# Patient Record
Sex: Female | Born: 1970 | Hispanic: No | Marital: Married | Smoking: Former smoker
Health system: Southern US, Community
[De-identification: ages and names within clinical notes are randomized; demographics above are authoritative.]

## PROBLEM LIST (undated history)

## (undated) DIAGNOSIS — K259 Gastric ulcer, unspecified as acute or chronic, without hemorrhage or perforation: Secondary | ICD-10-CM

## (undated) DIAGNOSIS — K648 Other hemorrhoids: Secondary | ICD-10-CM

## (undated) DIAGNOSIS — K219 Gastro-esophageal reflux disease without esophagitis: Secondary | ICD-10-CM

## (undated) HISTORY — PX: ECTOPIC PREGNANCY SURGERY: SHX613

## (undated) HISTORY — DX: Gastric ulcer, unspecified as acute or chronic, without hemorrhage or perforation: K25.9

## (undated) HISTORY — PX: EYE SURGERY: SHX253

## (undated) HISTORY — PX: KNEE SURGERY: SHX244

## (undated) HISTORY — PX: TUBAL LIGATION: SHX77

## (undated) HISTORY — DX: Gastro-esophageal reflux disease without esophagitis: K21.9

## (undated) HISTORY — PX: APPENDECTOMY: SHX54

## (undated) HISTORY — DX: Other hemorrhoids: K64.8

## (undated) HISTORY — PX: ABDOMINAL HYSTERECTOMY: SHX81

---

## 1998-10-06 ENCOUNTER — Other Ambulatory Visit: Admission: RE | Admit: 1998-10-06 | Discharge: 1998-10-06 | Payer: Self-pay | Admitting: Family Medicine

## 2000-07-29 ENCOUNTER — Other Ambulatory Visit: Admission: RE | Admit: 2000-07-29 | Discharge: 2000-07-29 | Payer: Self-pay | Admitting: Family Medicine

## 2000-07-31 ENCOUNTER — Encounter: Payer: Self-pay | Admitting: Family Medicine

## 2000-07-31 ENCOUNTER — Ambulatory Visit (HOSPITAL_COMMUNITY): Admission: RE | Admit: 2000-07-31 | Discharge: 2000-07-31 | Payer: Self-pay | Admitting: Family Medicine

## 2002-02-03 ENCOUNTER — Other Ambulatory Visit: Admission: RE | Admit: 2002-02-03 | Discharge: 2002-02-03 | Payer: Self-pay | Admitting: Gynecology

## 2003-01-25 ENCOUNTER — Other Ambulatory Visit: Admission: RE | Admit: 2003-01-25 | Discharge: 2003-01-25 | Payer: Self-pay | Admitting: Dermatology

## 2007-01-30 DIAGNOSIS — K648 Other hemorrhoids: Secondary | ICD-10-CM

## 2007-01-30 HISTORY — DX: Other hemorrhoids: K64.8

## 2007-07-01 ENCOUNTER — Ambulatory Visit: Payer: Self-pay | Admitting: Gastroenterology

## 2007-07-01 DIAGNOSIS — R1032 Left lower quadrant pain: Secondary | ICD-10-CM | POA: Insufficient documentation

## 2007-07-01 DIAGNOSIS — K219 Gastro-esophageal reflux disease without esophagitis: Secondary | ICD-10-CM | POA: Insufficient documentation

## 2007-07-01 DIAGNOSIS — R197 Diarrhea, unspecified: Secondary | ICD-10-CM | POA: Insufficient documentation

## 2007-07-01 HISTORY — DX: Left lower quadrant pain: R10.32

## 2007-07-01 LAB — CONVERTED CEMR LAB
ALT: 17 units/L (ref 0–35)
AST: 22 units/L (ref 0–37)
Albumin: 3.9 g/dL (ref 3.5–5.2)
Alkaline Phosphatase: 56 units/L (ref 39–117)
BUN: 13 mg/dL (ref 6–23)
Basophils Absolute: 0 10*3/uL (ref 0.0–0.1)
Basophils Relative: 0.3 % (ref 0.0–1.0)
Bilirubin, Direct: 0.1 mg/dL (ref 0.0–0.3)
CO2: 27 meq/L (ref 19–32)
Calcium: 9.1 mg/dL (ref 8.4–10.5)
Chloride: 104 meq/L (ref 96–112)
Creatinine, Ser: 0.9 mg/dL (ref 0.4–1.2)
Eosinophils Absolute: 0.1 10*3/uL (ref 0.0–0.7)
Eosinophils Relative: 2.6 % (ref 0.0–5.0)
GFR calc Af Amer: 91 mL/min
GFR calc non Af Amer: 75 mL/min
Glucose, Bld: 91 mg/dL (ref 70–99)
HCT: 38.1 % (ref 36.0–46.0)
Hemoglobin: 12.9 g/dL (ref 12.0–15.0)
Lymphocytes Relative: 32.5 % (ref 12.0–46.0)
MCHC: 33.7 g/dL (ref 30.0–36.0)
MCV: 89.7 fL (ref 78.0–100.0)
Monocytes Absolute: 0.3 10*3/uL (ref 0.1–1.0)
Monocytes Relative: 6.4 % (ref 3.0–12.0)
Neutro Abs: 3.2 10*3/uL (ref 1.4–7.7)
Neutrophils Relative %: 58.2 % (ref 43.0–77.0)
Platelets: 302 10*3/uL (ref 150–400)
Potassium: 3.7 meq/L (ref 3.5–5.1)
RBC: 4.25 M/uL (ref 3.87–5.11)
RDW: 12.6 % (ref 11.5–14.6)
Sed Rate: 13 mm/hr (ref 0–22)
Sodium: 140 meq/L (ref 135–145)
TSH: 0.99 microintl units/mL (ref 0.35–5.50)
Tissue Transglutaminase Ab, IgA: 0.4 units (ref <7)
Total Bilirubin: 0.9 mg/dL (ref 0.3–1.2)
Total Protein: 7.1 g/dL (ref 6.0–8.3)
WBC: 5.3 10*3/uL (ref 4.5–10.5)

## 2007-07-03 ENCOUNTER — Encounter: Payer: Self-pay | Admitting: Gastroenterology

## 2007-07-09 ENCOUNTER — Ambulatory Visit: Payer: Self-pay | Admitting: Gastroenterology

## 2007-07-09 ENCOUNTER — Encounter: Payer: Self-pay | Admitting: Gastroenterology

## 2007-07-09 DIAGNOSIS — K648 Other hemorrhoids: Secondary | ICD-10-CM | POA: Insufficient documentation

## 2007-07-15 ENCOUNTER — Encounter: Payer: Self-pay | Admitting: Gastroenterology

## 2007-07-24 ENCOUNTER — Ambulatory Visit: Payer: Self-pay | Admitting: Gastroenterology

## 2009-05-30 ENCOUNTER — Ambulatory Visit (HOSPITAL_COMMUNITY): Admission: RE | Admit: 2009-05-30 | Discharge: 2009-05-31 | Payer: Self-pay | Admitting: Obstetrics and Gynecology

## 2009-05-30 ENCOUNTER — Encounter (INDEPENDENT_AMBULATORY_CARE_PROVIDER_SITE_OTHER): Payer: Self-pay | Admitting: Obstetrics and Gynecology

## 2009-06-15 ENCOUNTER — Inpatient Hospital Stay (HOSPITAL_COMMUNITY): Admission: AD | Admit: 2009-06-15 | Discharge: 2009-06-16 | Payer: Self-pay | Admitting: Obstetrics and Gynecology

## 2010-02-19 ENCOUNTER — Encounter: Payer: Self-pay | Admitting: Family Medicine

## 2010-03-15 ENCOUNTER — Other Ambulatory Visit: Payer: Self-pay | Admitting: Family Medicine

## 2010-03-15 DIAGNOSIS — R109 Unspecified abdominal pain: Secondary | ICD-10-CM

## 2010-04-17 LAB — URINALYSIS, ROUTINE W REFLEX MICROSCOPIC
Ketones, ur: NEGATIVE mg/dL
Leukocytes, UA: NEGATIVE
Nitrite: NEGATIVE
Specific Gravity, Urine: 1.01 (ref 1.005–1.030)
pH: 5.5 (ref 5.0–8.0)

## 2010-04-17 LAB — CBC
MCV: 89.4 fL (ref 78.0–100.0)
Platelets: 371 10*3/uL (ref 150–400)
WBC: 8.3 10*3/uL (ref 4.0–10.5)

## 2010-04-17 LAB — URINE MICROSCOPIC-ADD ON

## 2010-04-18 LAB — BASIC METABOLIC PANEL
BUN: 15 mg/dL (ref 6–23)
Chloride: 102 mEq/L (ref 96–112)
GFR calc Af Amer: >60 mL/min (ref >60)
Potassium: 4.1 mEq/L (ref 3.5–5.1)

## 2010-04-18 LAB — CBC
HCT: 33.3 % — ABNORMAL LOW (ref 36.0–46.0)
HCT: 40.4 % (ref 36.0–46.0)
MCV: 90.6 fL (ref 78.0–100.0)
Platelets: 224 10*3/uL (ref 150–400)
RBC: 3.61 MIL/uL — ABNORMAL LOW (ref 3.87–5.11)
RBC: 4.46 MIL/uL (ref 3.87–5.11)
WBC: 10.5 10*3/uL (ref 4.0–10.5)
WBC: 6.3 10*3/uL (ref 4.0–10.5)

## 2012-01-30 HISTORY — PX: ESOPHAGOGASTRODUODENOSCOPY: SHX1529

## 2012-01-30 HISTORY — PX: COLONOSCOPY: SHX174

## 2012-06-24 ENCOUNTER — Encounter: Payer: Self-pay | Admitting: General Practice

## 2012-06-24 ENCOUNTER — Emergency Department (HOSPITAL_COMMUNITY)
Admission: EM | Admit: 2012-06-24 | Discharge: 2012-06-24 | Disposition: A | Discharge status: Home / Self Care | Payer: Managed Care, Other (non HMO) | Attending: Emergency Medicine | Admitting: Emergency Medicine

## 2012-06-24 ENCOUNTER — Encounter (HOSPITAL_COMMUNITY): Payer: Self-pay | Admitting: *Deleted

## 2012-06-24 ENCOUNTER — Ambulatory Visit (INDEPENDENT_AMBULATORY_CARE_PROVIDER_SITE_OTHER): Payer: Managed Care, Other (non HMO) | Admitting: General Practice

## 2012-06-24 ENCOUNTER — Emergency Department (HOSPITAL_COMMUNITY): Payer: Managed Care, Other (non HMO)

## 2012-06-24 VITALS — BP 106/72 | HR 68 | Temp 97.9°F | Ht 64.75 in | Wt 209.0 lb

## 2012-06-24 DIAGNOSIS — R079 Chest pain, unspecified: Secondary | ICD-10-CM

## 2012-06-24 DIAGNOSIS — R0789 Other chest pain: Secondary | ICD-10-CM | POA: Insufficient documentation

## 2012-06-24 DIAGNOSIS — Z87891 Personal history of nicotine dependence: Secondary | ICD-10-CM | POA: Insufficient documentation

## 2012-06-24 LAB — CBC
HCT: 38.4 % (ref 36.0–46.0)
MCHC: 33.1 g/dL (ref 30.0–36.0)
MCV: 84.8 fL (ref 78.0–100.0)
RDW: 13.2 % (ref 11.5–15.5)

## 2012-06-24 LAB — BASIC METABOLIC PANEL
BUN: 16 mg/dL (ref 6–23)
Creatinine, Ser: 0.86 mg/dL (ref 0.50–1.10)
GFR calc Af Amer: >90 mL/min (ref >90)
GFR calc non Af Amer: 82 mL/min — ABNORMAL LOW (ref >90)

## 2012-06-24 NOTE — ED Notes (Signed)
PT states that she started having left chest pain and between shoulder blades that started on Saturday while drinking with friends.  Pt states pain in back did go away but continues to have heaviness in her chest

## 2012-06-24 NOTE — ED Provider Notes (Signed)
History     CSN: [id number]  Arrival date & time 06/24/12  1422   First MD Initiated Contact with Patient 06/24/12 1558      Chief Complaint  Patient presents with  . Chest Pain    (Consider location/radiation/quality/duration/timing/severity/associated sxs/prior treatment) HPI Comments: 42 y.o. female w/ no significant pmh presents to the Er w/ the cc of chest pain. Has been present for the past 3 days. Started when pt was 'having drinks with friends' on Saturday. Pt states that pain has been waxing and waning. Is not related to activity. No n/v associated w/ this. No diaphoresis associated w/ this. No exertional component. Pt states that she has a "heaviness" in her chest, and not a pain currently. She does not have pain that radiates down her right arm or both arms. She was seen by pcp today, and then told to come to the ER for further evaluation.   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   Patient is a 42 y.o. female presenting with general illness. The history is provided by the patient.  Illness Location:  Chest pain Severity:  Mild Duration:  3 days Timing:  Intermittent Progression:  Waxing and waning Chronicity:  New Associated symptoms: chest pain   Associated symptoms: no abdominal pain, no congestion, no cough, no diarrhea, no fatigue, no fever, no headaches, no rash, no vomiting and no wheezing     History reviewed. No pertinent past medical history.  Past Surgical History  Procedure Laterality Date  . Appendectomy    . Abdominal hysterectomy    . Eye surgery    . Tubal ligation    . Ectopic pregnancy surgery    . Knee surgery Left     Family History  Problem Relation Age of Onset  . Anuerysm Mother     History  Substance Use Topics  . Smoking  status: Former Games developer  . Smokeless tobacco: Not on file  . Alcohol Use: Yes     Comment: occ    OB History   Grav Para Term Preterm Abortions TAB SAB Ect Mult Living                  Review of Systems  Constitutional: Negative for fever, chills and fatigue.  HENT: Negative for congestion, facial swelling, drooling, neck pain and dental problem.   Eyes: Negative for pain, discharge and itching.  Respiratory: Negative for cough, choking, wheezing and stridor.   Cardiovascular: Positive for chest pain.  Gastrointestinal: Negative for vomiting, abdominal pain and diarrhea.  Endocrine: Negative for cold intolerance and heat intolerance.  Genitourinary: Negative for vaginal discharge, difficulty urinating and vaginal pain.  Skin: Negative for pallor and rash.  Neurological: Negative for dizziness, light-headedness and headaches.  Psychiatric/Behavioral: Negative for behavioral problems and agitation.    Allergies  Biaxin and Demerol  Home Medications   Current Outpatient Rx  Name  Route  Sig  Dispense  Refill  . Cholecalciferol (VITAMIN D PO)   Oral   Take 1 tablet by mouth daily.         . Naproxen Sodium (ALEVE) 220 MG CAPS   Oral   Take 440 mg by mouth daily as needed (shoulder pain).  BP 137/75  Pulse 66  Temp(Src) 98.4 F (36.9 C) (Oral)  Resp 18  SpO2 100%  Physical Exam  Constitutional: She is oriented to person, place, and time. She appears well-developed. No distress.  HENT:  Head: Normocephalic and atraumatic.  Eyes: Pupils are equal, round, and reactive to light. Right eye exhibits no discharge. Left eye exhibits no discharge.  Neck: Neck supple. No tracheal deviation present.  Cardiovascular: Normal rate.  Exam reveals no gallop and no friction rub.   Pulmonary/Chest: No stridor. No respiratory distress. She has no wheezes. Tenderness: mild chest wall ttp.  Abdominal: Soft. She exhibits no distension. There is no tenderness. There is no  rebound.  Musculoskeletal: She exhibits no edema and no tenderness.  Neurological: She is alert and oriented to person, place, and time.  Skin: Skin is warm. She is not diaphoretic.    ED Course  Procedures (including critical care time)  Labs Reviewed  BASIC METABOLIC PANEL - Abnormal; Notable for the following:    GFR calc non Af Amer 82 (*)    All other components within normal limits  CBC  POCT I-STAT TROPONIN I   Dg Chest 2 View  06/24/2012   *RADIOLOGY REPORT*  Clinical Data: Chest pain and chest tightness.  CHEST - 2 VIEW  Comparison: No priors.  Findings: Lung volumes are normal.  No consolidative airspace disease.  No pleural effusions.  No pneumothorax.  No pulmonary nodule or mass noted.  Pulmonary vasculature and the cardiomediastinal silhouette are within normal limits.  IMPRESSION: 1. No radiographic evidence of acute cardiopulmonary disease.   Original Report Authenticated By: Trudie Reed, M.D.    Date: 06/24/2012  Rate: 68  Rhythm: normal sinus rhythm  QRS Axis: normal  Intervals: normal  ST/T Wave abnormalities: normal  Conduction Disutrbances:none  Narrative Interpretation: No inverted T waves or pathologic ST wave abnormalities.   Old EKG Reviewed: none available    MDM  Pt with atypical story for ACS -- ECG is normal and trop is negative. Her TIMI score is 0, she has no risk factors for ACS -- no family hx of cardiac disease before age 27, and no hx of htn / no smoking / no hx of diabetes. Would expect to see ECG changes or elevated trop if pt was truly having ACS over the past few days. Chest x-ray does not show pneumonia. Doubt PE, pt is PERC negative.   Lipase is pending.   Lipase normal. Doubt pt is having ACS with normal labs, would have expected trop to be elevated by now w/ pt having symptoms over the past 3 days. Have told pt f/u with pcp is very important as further workup might be required -- told to f/u tomorrow, which she states she will do.     1. Chest pain              Bernadene Person, MD 06/24/12 2359

## 2012-06-24 NOTE — Patient Instructions (Addendum)
Chest Pain (Nonspecific) °It is often hard to give a specific diagnosis for the cause of chest pain. There is always a chance that your pain could be related to something serious, such as a heart attack or a blood clot in the lungs. You need to follow up with your caregiver for further evaluation. °CAUSES  °· Heartburn. °· Pneumonia or bronchitis. °· Anxiety or stress. °· Inflammation around your heart (pericarditis) or lung (pleuritis or pleurisy). °· A blood clot in the lung. °· A collapsed lung (pneumothorax). It can develop suddenly on its own (spontaneous pneumothorax) or from injury (trauma) to the chest. °· Shingles infection (herpes zoster virus). °The chest wall is composed of bones, muscles, and cartilage. Any of these can be the source of the pain. °· The bones can be bruised by injury. °· The muscles or cartilage can be strained by coughing or overwork. °· The cartilage can be affected by inflammation and become sore (costochondritis). °DIAGNOSIS  °Lab tests or other studies, such as X-rays, electrocardiography, stress testing, or cardiac imaging, may be needed to find the cause of your pain.  °TREATMENT  °· Treatment depends on what may be causing your chest pain. Treatment may include: °· Acid blockers for heartburn. °· Anti-inflammatory medicine. °· Pain medicine for inflammatory conditions. °· Antibiotics if an infection is present. °· You may be advised to change lifestyle habits. This includes stopping smoking and avoiding alcohol, caffeine, and chocolate. °· You may be advised to keep your head raised (elevated) when sleeping. This reduces the chance of acid going backward from your stomach into your esophagus. °· Most of the time, nonspecific chest pain will improve within 2 to 3 days with rest and mild pain medicine. °HOME CARE INSTRUCTIONS  °· If antibiotics were prescribed, take your antibiotics as directed. Finish them even if you start to feel better. °· For the next few days, avoid physical  activities that bring on chest pain. Continue physical activities as directed. °· Do not smoke. °· Avoid drinking alcohol. °· Only take over-the-counter or prescription medicine for pain, discomfort, or fever as directed by your caregiver. °· Follow your caregiver's suggestions for further testing if your chest pain does not go away. °· Keep any follow-up appointments you made. If you do not go to an appointment, you could develop lasting (chronic) problems with pain. If there is any problem keeping an appointment, you must call to reschedule. °SEEK MEDICAL CARE IF:  °· You think you are having problems from the medicine you are taking. Read your medicine instructions carefully. °· Your chest pain does not go away, even after treatment. °· You develop a rash with blisters on your chest. °SEEK IMMEDIATE MEDICAL CARE IF:  °· You have increased chest pain or pain that spreads to your arm, neck, jaw, back, or abdomen. °· You develop shortness of breath, an increasing cough, or you are coughing up blood. °· You have severe back or abdominal pain, feel nauseous, or vomit. °· You develop severe weakness, fainting, or chills. °· You have a fever. °THIS IS AN EMERGENCY. Do not wait to see if the pain will go away. Get medical help at once. Call your local emergency services (911 in [place] Do not drive yourself to the hospital. °MAKE SURE YOU:  °· Understand these instructions. °· Will watch your condition. °· Will get help right away if you are not doing well or get worse. °Document Released: 10/25/2004 Document Revised: 04/09/2011 Document Reviewed: 08/21/2007 °ExitCare® Patient Information ©2014 [employer]   [employer]. ° °

## 2012-06-24 NOTE — Progress Notes (Signed)
  Subjective:    Patient ID: Rhonda Anthony, female    DOB: 1970/08/25, 42 y.o.   MRN: [id number]  HPI Presents today with chest pain that she rates as 4 on 1-10 scale. Reports onset of chest pain was Saturday and rated as 7 then. The pain was mid chest that radiated to shoulder blades and left arm down to elbow. Also reports having left jaw pain. Reports taking goody powder on Saturday, denies seeking medical attention. Denies dizziness or shortness of breath on Saturday. Reports shortness of breath and chest pain/pressure on both Sunday/Monday. Reports taking maalox on Monday.Today reports dizziness, shortness of breath, chest pressure. Denies any history of these symptoms.    Review of Systems  Constitutional: Negative for fever and chills.  HENT: Negative for facial swelling.   Respiratory: Positive for chest tightness and shortness of breath.   Cardiovascular: Positive for chest pain.  Gastrointestinal: Positive for abdominal pain.  Neurological: Positive for dizziness, numbness and headaches.       Numbness in left hand fingers       Objective:   Physical Exam  Constitutional: She is oriented to person, place, and time. She appears well-developed and well-nourished.  HENT:  Head: Normocephalic and atraumatic.  Cardiovascular: Normal rate, regular rhythm and normal heart sounds.   No murmur heard. Pulmonary/Chest: Effort normal and breath sounds normal. No respiratory distress. She has no wheezes. She has no rales. She exhibits no tenderness.  Patient appears to be in no respiratory distress. Able to speak in complete sentences without difficulty.   Abdominal: Soft. Bowel sounds are normal. She exhibits no distension. There is no tenderness.  Neurological: She is alert and oriented to person, place, and time.  Skin: Skin is warm and dry.  Psychiatric: She has a normal mood and affect.          Assessment & Plan:  1. Chest pain - EKG 12-Lead  -Patient being sent to ER  for evaluation, pt's daughter to transport  -discussed actions to take if symptoms worsen -Patient and her daughter verbalized understanding and in agreement -Report called to [facility] emergency room triage nurse -Coralie Keens, FNP-C

## 2012-06-25 ENCOUNTER — Telehealth: Payer: Self-pay | Admitting: General Practice

## 2012-06-25 NOTE — Telephone Encounter (Signed)
Pt needed hospital follow up and appt given for 5/29 at 10:40 with Modesto Charon. Pt requested him

## 2012-06-26 ENCOUNTER — Ambulatory Visit (INDEPENDENT_AMBULATORY_CARE_PROVIDER_SITE_OTHER): Payer: Managed Care, Other (non HMO) | Admitting: Family Medicine

## 2012-06-26 ENCOUNTER — Encounter: Payer: Self-pay | Admitting: Family Medicine

## 2012-06-26 VITALS — BP 122/85 | HR 64 | Temp 98.3°F | Wt 208.4 lb

## 2012-06-26 DIAGNOSIS — F4321 Adjustment disorder with depressed mood: Secondary | ICD-10-CM

## 2012-06-26 DIAGNOSIS — Z566 Other physical and mental strain related to work: Secondary | ICD-10-CM | POA: Insufficient documentation

## 2012-06-26 DIAGNOSIS — R079 Chest pain, unspecified: Secondary | ICD-10-CM

## 2012-06-26 DIAGNOSIS — M25519 Pain in unspecified shoulder: Secondary | ICD-10-CM

## 2012-06-26 DIAGNOSIS — Z569 Unspecified problems related to employment: Secondary | ICD-10-CM

## 2012-06-26 DIAGNOSIS — M25511 Pain in right shoulder: Secondary | ICD-10-CM

## 2012-06-26 DIAGNOSIS — R635 Abnormal weight gain: Secondary | ICD-10-CM

## 2012-06-26 HISTORY — DX: Other physical and mental strain related to work: Z56.6

## 2012-06-26 HISTORY — DX: Chest pain, unspecified: R07.9

## 2012-06-26 LAB — COMPLETE METABOLIC PANEL WITH GFR
ALT: 27 U/L (ref 0–35)
AST: 25 U/L (ref 0–37)
Albumin: 4.3 g/dL (ref 3.5–5.2)
Alkaline Phosphatase: 63 U/L (ref 39–117)
BUN: 16 mg/dL (ref 6–23)
CO2: 26 mEq/L (ref 19–32)
Calcium: 9.4 mg/dL (ref 8.4–10.5)
Chloride: 101 mEq/L (ref 96–112)
Creat: 0.9 mg/dL (ref 0.50–1.10)
GFR, Est African American: >89 mL/min
GFR, Est Non African American: 79 mL/min
Glucose, Bld: 83 mg/dL (ref 70–99)
Potassium: 4.3 mEq/L (ref 3.5–5.3)
Sodium: 139 mEq/L (ref 135–145)
Total Bilirubin: 0.5 mg/dL (ref 0.3–1.2)
Total Protein: 7.2 g/dL (ref 6.0–8.3)

## 2012-06-26 LAB — THYROID PANEL WITH TSH
Free Thyroxine Index: 4 — ABNORMAL HIGH (ref 1.0–3.9)
T3 Uptake: 38.1 % — ABNORMAL HIGH (ref 22.5–37.0)
T4, Total: 10.6 ug/dL (ref 5.0–12.5)
TSH: 0.604 u[IU]/mL (ref 0.350–4.500)

## 2012-06-26 MED ORDER — CELECOXIB 200 MG PO CAPS: 200.0000 mg | ORAL_CAPSULE | Freq: Two times a day (BID) | ORAL | Status: DC

## 2012-06-26 NOTE — ED Provider Notes (Signed)
I saw and evaluated the patient, reviewed the resident's note and I agree with the findings and plan. Agree with EKG interpretation if present.   Atypical chest pain, ongoing for several days with neg trop and Ekg. Low risk for CAD, plan discharge with close outpatient followup.   Dymir Neeson B. Bernette Mayers, MD 06/26/12 [id number]

## 2012-06-26 NOTE — Progress Notes (Signed)
Patient ID: DAIZHA ANAND, female   DOB: 1970/12/17, 42 y.o.   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 Has to take care of her mother-in-law. Business is good. Stress is high. Husband got a promotion at [employer] and away a lot. The kids are good. On the verge of crying all the time.  Sex life is down due to both her and her husband being busy. Sleep: 6 hours. Not restfull. Wakes at night. Waking early. Mind racing now.  PMH/PSH: reviewed/updated in Epic  SH/FH: reviewed/updated in Epic  Allergies: reviewed/updated in Epic  Medications: reviewed/updated in Epic  Immunizations: reviewed/updated in Epic  ROS: As above in the HPI. All other systems are stable or negative.  OBJECTIVE: APPEARANCE:  Patient in no acute distress.The patient appeared well nourished and normally developed. Acyanotic. Waist: VITAL SIGNS:BP 122/85  Pulse 64  Temp(Src) 98.3 F (36.8 C) (Oral)  Wt 208 lb 6.4 oz (94.53 kg)  BMI 34.93 kg/m2 Obese Female. Tearful when talking about her father-in-law.  SKIN: warm and  Dry without overt rashes, tattoos and scars  HEAD and Neck: without JVD, Head and scalp: normal Eyes:No scleral icterus. Fundi normal, eye movements normal. Ears: Auricle normal, canal normal, Tympanic membranes normal, insufflation normal. Nose: normal Throat: normal Neck & thyroid: normal  CHEST & LUNGS: Chest wall: normal Lungs: Clear  CVS: Reveals the PMI to be normally located. Regular rhythm, First and Second Heart sounds are normal,  absence of murmurs, rubs or gallops. Peripheral vasculature: Radial pulses: normal Dorsal pedis pulses: normal Posterior pulses: normal  ABDOMEN:  Appearance: normal Benign,, no organomegaly, no masses, no Abdominal Aortic enlargement. No Guarding , no rebound. No Bruits. Bowel sounds: normal  RECTAL: N/A GU: N/A  EXTREMETIES:  nonedematous. Both Femoral and Pedal pulses are normal.  MUSCULOSKELETAL:  Spine: normal Joints: intact  NEUROLOGIC: oriented to time,place and person; nonfocal. Strength is normal Sensory is normal Reflexes are normal Cranial Nerves are normal.  Results for orders placed during the hospital encounter of 06/24/12  CBC      Result Value Range   WBC 6.7  4.0 - 10.5 K/uL   RBC 4.53  3.87 - 5.11 MIL/uL   Hemoglobin 12.7  12.0 - 15.0 g/dL   HCT 40.9  81.1 - 91.4 %   MCV 84.8  78.0 - 100.0 fL   MCH 28.0  26.0 - 34.0 pg   MCHC 33.1  30.0 - 36.0 g/dL   RDW 78.2  95.6 - 21.3 %   Platelets 314  150 - 400 K/uL  BASIC METABOLIC PANEL      Result Value Range   Sodium 138  135 - 145 mEq/L   Potassium 3.7  3.5 - 5.1 mEq/L   Chloride 99  96 - 112 mEq/L   CO2 23  19 - 32 mEq/L   Glucose, Bld 95  70 - 99 mg/dL   BUN 16  6 - 23 mg/dL   Creatinine, Ser 0.86  0.50 - 1.10 mg/dL   Calcium 9.6  8.4 - 57.8 mg/dL   GFR calc non Af Amer 82 (*) >90 mL/min   GFR calc Af Amer >90  >90 mL/min  LIPASE, BLOOD      Result Value Range   Lipase 29  11 - 59 U/L  POCT I-STAT TROPONIN I      Result Value Range   Troponin i, poc 0.00  0.00 - 0.08  ng/mL   Comment 3             ASSESSMENT: Stress at work  Grieving  Chest pain - Plan: Thyroid Panel With TSH, COMPLETE METABOLIC PANEL WITH GFR, Helicobacter pylori abs-IgG+IgA, bld  Weight gain  Pain in joint, shoulder region, right - Plan: celecoxib (CELEBREX) 200 MG capsule noncardiac chest pain.  PLAN: Orders Placed This Encounter  Procedures  . Thyroid Panel With TSH  . COMPLETE METABOLIC PANEL WITH GFR  . Helicobacter pylori abs-IgG+IgA, bld   Meds ordered this encounter  Medications  . celecoxib (CELEBREX) 200 MG capsule    Sig: Take 1 capsule (200 mg total) by mouth 2 (two) times daily.    Dispense:  30 capsule    Refill:  0   Coupon for celebrex given. D/C the naproxen, it could have contributed to her symptoms if she had an  esophagitis. Use OTC prilosec for 4 weeks.  Recommend hospice grief counselling.  Supportive therapy. Stress reduction.  RTc in 4 weeks.  Await labs.  Alyric Parkin P. Modesto Charon, M.D.

## 2012-06-30 LAB — HELICOBACTER PYLORI ABS-IGG+IGA, BLD
H Pylori IgG: 0.68 {ISR}
HELICOBACTER PYLORI AB, IGA: 3.5 U/mL (ref <9.0)

## 2012-07-01 ENCOUNTER — Telehealth: Payer: Self-pay | Admitting: Family Medicine

## 2012-07-01 NOTE — Progress Notes (Signed)
Quick Note:  Call patient. Labs normal. No change in plan. ______ 

## 2012-07-01 NOTE — Telephone Encounter (Signed)
Pt notified that results have  Been received but not reviewed by dr Modesto Charon  Advised pt to sign up for mychart  Pt agreed and aware we will call as soon as results reviewed.

## 2012-08-15 ENCOUNTER — Ambulatory Visit (INDEPENDENT_AMBULATORY_CARE_PROVIDER_SITE_OTHER): Payer: Managed Care, Other (non HMO) | Admitting: Family Medicine

## 2012-08-15 ENCOUNTER — Ambulatory Visit (INDEPENDENT_AMBULATORY_CARE_PROVIDER_SITE_OTHER): Payer: Managed Care, Other (non HMO)

## 2012-08-15 ENCOUNTER — Encounter: Payer: Self-pay | Admitting: Family Medicine

## 2012-08-15 VITALS — BP 127/77 | HR 64 | Temp 98.0°F | Wt 213.2 lb

## 2012-08-15 DIAGNOSIS — R635 Abnormal weight gain: Secondary | ICD-10-CM

## 2012-08-15 DIAGNOSIS — M25511 Pain in right shoulder: Secondary | ICD-10-CM

## 2012-08-15 DIAGNOSIS — Z566 Other physical and mental strain related to work: Secondary | ICD-10-CM

## 2012-08-15 DIAGNOSIS — M25519 Pain in unspecified shoulder: Secondary | ICD-10-CM

## 2012-08-15 DIAGNOSIS — Z569 Unspecified problems related to employment: Secondary | ICD-10-CM

## 2012-08-15 DIAGNOSIS — K219 Gastro-esophageal reflux disease without esophagitis: Secondary | ICD-10-CM

## 2012-08-15 MED ORDER — PREDNISONE 20 MG PO TABS: 40.0000 mg | ORAL_TABLET | Freq: Every day | ORAL | Status: DC

## 2012-08-15 NOTE — Progress Notes (Signed)
Patient ID: Rhonda Anthony, female   DOB: 1970/09/27, 42 y.o.   MRN: [id number] SUBJECTIVE: CC: Chief Complaint  Patient presents with  . Follow-up    1 month follow up cont to have pain in rt shoulder    HPI: 1) right arm goes numb and weak at times  2) grieving better with family.  3) work stress: slow down. Its better.  No past medical history on file. Past Surgical History  Procedure Laterality Date  . Appendectomy    . Abdominal hysterectomy    . Eye surgery    . Tubal ligation    . Ectopic pregnancy surgery    . Knee surgery Left    History   Social History  . Marital Status: Married    Spouse Name: N/A    Number of Children: N/A  . Years of Education: N/A   Occupational History  . Not on file.   Social History Main Topics  . Smoking status: Former Games developer  . Smokeless tobacco: Not on file  . Alcohol Use: Yes     Comment: occ  . Drug Use: No  . Sexually Active: Not on file   Other Topics Concern  . Not on file   Social History Narrative  . No narrative on file   Family History  Problem Relation Age of Onset  . Anuerysm Mother    Current Outpatient Prescriptions on File Prior to Visit  Medication Sig Dispense Refill  . celecoxib (CELEBREX) 200 MG capsule Take 1 capsule (200 mg total) by mouth 2 (two) times daily.  30 capsule  0  . Cholecalciferol (VITAMIN D PO) Take 1 tablet by mouth daily.       No current facility-administered medications on file prior to visit.   Allergies  Allergen Reactions  . Biaxin (Clarithromycin)     unknown  . Demerol (Meperidine)     unknown   Immunization History  Administered Date(s) Administered  . Tdap 09/29/2010   Prior to Admission medications   Medication Sig Start Date End Date Taking? Authorizing Provider  celecoxib (CELEBREX) 200 MG capsule Take 1 capsule (200 mg total) by mouth 2 (two) times daily. 06/26/12  Yes Ileana Ladd, MD  Cholecalciferol (VITAMIN D PO) Take 1 tablet by mouth daily.   Yes  Historical Provider, MD  predniSONE (DELTASONE) 20 MG tablet Take 2 tablets (40 mg total) by mouth daily. For 3 days then 1 tab daily for 2 days, then 1/2 tab daily for 2 days. 08/15/12   Ileana Ladd, MD    ROS: As above in the HPI. All other systems are stable or negative.  OBJECTIVE: APPEARANCE:  Patient in no acute distress.The patient appeared well nourished and normally developed. Acyanotic. Waist: VITAL SIGNS:BP 127/77  Pulse 64  Temp(Src) 98 F (36.7 C) (Oral)  Wt 213 lb 3.2 oz (96.707 kg)  BMI 35.74 kg/m2 Obese   SKIN: warm and  Dry without overt rashes, tattoos and scars  HEAD and Neck: without JVD, Head and scalp: normal Eyes:No scleral icterus. Fundi normal, eye movements normal. Ears: Auricle normal, canal normal, Tympanic membranes normal, insufflation normal. Nose: normal Throat: normal Neck & thyroid: normal  CHEST & LUNGS: Chest wall: normal Lungs: Clear  CVS: Reveals the PMI to be normally located. Regular rhythm, First and Second Heart sounds are normal,  absence of murmurs, rubs or gallops. Peripheral vasculature: Radial pulses: normal Dorsal pedis pulses: normal Posterior pulses: normal  ABDOMEN:  Appearance: normal Benign, no organomegaly,  no masses, no Abdominal Aortic enlargement. No Guarding , no rebound. No Bruits. Bowel sounds: normal  RECTAL: N/A GU: N/A  EXTREMETIES: nonedematous. Both Femoral and Pedal pulses are normal.  MUSCULOSKELETAL:  Spine: normal Joints: right shoulder pain on abduction and internal rotation with a painful arc.  NEUROLOGIC: oriented to time,place and person; nonfocal. Strength is normal Sensory is normal Reflexes are normal Cranial Nerves are normal. Results for orders placed in visit on 06/26/12  THYROID PANEL WITH TSH      Result Value Range   T4, Total 10.6  5.0 - 12.5 ug/dL   T3 Uptake 40.1 (*) 02.7 - 37.0 %   Free Thyroxine Index 4.0 (*) 1.0 - 3.9   TSH 0.604  0.350 - 4.500 uIU/mL   COMPLETE METABOLIC PANEL WITH GFR      Result Value Range   Sodium 139  135 - 145 mEq/L   Potassium 4.3  3.5 - 5.3 mEq/L   Chloride 101  96 - 112 mEq/L   CO2 26  19 - 32 mEq/L   Glucose, Bld 83  70 - 99 mg/dL   BUN 16  6 - 23 mg/dL   Creat 2.53  6.64 - 4.03 mg/dL   Total Bilirubin 0.5  0.3 - 1.2 mg/dL   Alkaline Phosphatase 63  39 - 117 U/L   AST 25  0 - 37 U/L   ALT 27  0 - 35 U/L   Total Protein 7.2  6.0 - 8.3 g/dL   Albumin 4.3  3.5 - 5.2 g/dL   Calcium 9.4  8.4 - 47.4 mg/dL   GFR, Est African American >89     GFR, Est Non African American 79    HELICOBACTER PYLORI ABS-IGG+IGA, BLD      Result Value Range   H Pylori IgG 0.68     HELICOBACTER PYLORI AB, IGA 3.5  <9.0 U/mL    ASSESSMENT: GERD  Pain in joint, shoulder region, right - Plan: DG Shoulder Right, Ambulatory referral to Orthopedic Surgery, predniSONE (DELTASONE) 20 MG tablet  Stress at work  Weight gain  PLAN: [facility] reading (PRIMARY) by  Dr. Modesto Charon: Right shoulder , no obvious disease seen.                                Orders Placed This Encounter  Procedures  . DG Shoulder Right    Standing Status: Future     Number of Occurrences: 1     Standing Expiration Date: 10/15/2013    Order Specific Question:  Reason for Exam (SYMPTOM  OR DIAGNOSIS REQUIRED)    Answer:  right shoulder pain    Order Specific Question:  Is the patient pregnant?    Answer:  No    Order Specific Question:  Preferred imaging location?    Answer:  Internal  . Ambulatory referral to Orthopedic Surgery    Referral Priority:  Routine    Referral Type:  Surgical    Referral Reason:  Specialty Services Required    Requested Specialty:  Orthopedic Surgery    Number of Visits Requested:  1   Meds ordered this encounter  Medications  . predniSONE (DELTASONE) 20 MG tablet    Sig: Take 2 tablets (40 mg total) by mouth daily. For 3 days then 1 tab daily for 2 days, then 1/2 tab daily for 2 days.    Dispense:  9 tablet    Refill:  0  Reviewed labs with patient.  Return if symptoms worsen or fail to improve.   Seferino Oscar P. Modesto Charon, M.D.

## 2012-11-05 ENCOUNTER — Ambulatory Visit: Payer: Managed Care, Other (non HMO)

## 2012-11-17 ENCOUNTER — Ambulatory Visit (INDEPENDENT_AMBULATORY_CARE_PROVIDER_SITE_OTHER): Payer: Managed Care, Other (non HMO) | Admitting: General Practice

## 2012-11-17 ENCOUNTER — Ambulatory Visit (INDEPENDENT_AMBULATORY_CARE_PROVIDER_SITE_OTHER): Payer: Managed Care, Other (non HMO)

## 2012-11-17 ENCOUNTER — Encounter: Payer: Self-pay | Admitting: General Practice

## 2012-11-17 ENCOUNTER — Telehealth: Payer: Self-pay | Admitting: Nurse Practitioner

## 2012-11-17 VITALS — BP 118/75 | HR 67 | Temp 97.4°F | Ht 64.75 in | Wt 211.5 lb

## 2012-11-17 DIAGNOSIS — R109 Unspecified abdominal pain: Secondary | ICD-10-CM

## 2012-11-17 DIAGNOSIS — K5900 Constipation, unspecified: Secondary | ICD-10-CM

## 2012-11-17 DIAGNOSIS — J029 Acute pharyngitis, unspecified: Secondary | ICD-10-CM

## 2012-11-17 LAB — POCT RAPID STREP A (OFFICE): Rapid Strep A Screen: NEGATIVE

## 2012-11-17 NOTE — Progress Notes (Signed)
Subjective:    Patient ID: Rhonda Anthony, female    DOB: 09/17/1970, 42 y.o.   MRN: [id number]  Sore Throat  This is a new problem. The current episode started in the past 7 days. The problem has been gradually worsening. Neither side of throat is experiencing more pain than the other. There has been no fever. The pain is at a severity of 6/10. Associated symptoms include abdominal pain and headaches. Pertinent negatives include no congestion, coughing, diarrhea, ear pain, shortness of breath or vomiting. She has tried nothing for the symptoms.  Abdominal Pain This is a new problem. The current episode started in the past 7 days. The onset quality is sudden. The problem occurs daily. The problem has been unchanged. The pain is located in the generalized abdominal region. The pain is at a severity of 3/10. The quality of the pain is aching. The abdominal pain does not radiate. Associated symptoms include headaches. Pertinent negatives include no belching, diarrhea, fever, frequency, hematuria, nausea or vomiting. Nothing aggravates the pain. The pain is relieved by nothing. She has tried nothing for the symptoms. There is no history of colon cancer, gallstones, GERD or PUD.       Review of Systems  Constitutional: Negative for fever and chills.  HENT: Positive for sore throat. Negative for congestion, ear pain, postnasal drip, rhinorrhea and sinus pressure.   Respiratory: Negative for cough, chest tightness, shortness of breath and wheezing.   Cardiovascular: Negative for chest pain and palpitations.  Gastrointestinal: Positive for abdominal pain. Negative for nausea, vomiting and diarrhea.  Genitourinary: Negative for frequency and hematuria.  Neurological: Positive for headaches. Negative for dizziness and weakness.       Objective:   Physical Exam  Constitutional: She is oriented to person, place, and time. She appears well-developed and well-nourished.  HENT:  Head: Normocephalic  and atraumatic.  Right Ear: External ear normal.  Left Ear: External ear normal.  Nose: Nose normal.  Mouth/Throat: Posterior oropharyngeal erythema present.  Eyes: Conjunctivae and EOM are normal.  Neck: Normal range of motion. Neck supple. No thyromegaly present.  Cardiovascular: Normal rate, regular rhythm and normal heart sounds.   Pulmonary/Chest: Effort normal and breath sounds normal. No respiratory distress. She exhibits no tenderness.  Abdominal: Soft. Bowel sounds are normal. She exhibits no distension. There is no tenderness.  Lymphadenopathy:    She has no cervical adenopathy.  Neurological: She is alert and oriented to person, place, and time.  Skin: Skin is warm and dry.  Psychiatric: She has a normal mood and affect.    Results for orders placed in visit on 11/17/12  POCT RAPID STREP A (OFFICE)      Result Value Range   Rapid Strep A Screen Negative  Negative   [facility] reading (PRIMARY) by Coralie Keens, FNP-C, large amount of stool in colon.                                        Assessment & Plan:  1. Sore throat  - POCT rapid strep A  2. Abdominal pain  - DG Abd 1 View; Future  3. Constipation Miralax 17grams daily, for 1-4 days, until bowel movement  Increase fluid intake (water) Increase fiber in diet (fruits, vegetables, whole grains) Take stool softner daily RTO if symptoms worsen or unresolved May seek emergency medical treatment Patient verbalized understanding Coralie Keens, FNP-C

## 2012-11-17 NOTE — Patient Instructions (Addendum)
Abdominal Pain Abdominal pain can be caused by many things. Your caregiver decides the seriousness of your pain by an examination and possibly blood tests and X-rays. Many cases can be observed and treated at home. Most abdominal pain is not caused by a disease and will probably improve without treatment. However, in many cases, more time must pass before a clear cause of the pain can be found. Before that point, it may not be known if you need more testing, or if hospitalization or surgery is needed. HOME CARE INSTRUCTIONS   Do not take laxatives unless directed by your caregiver.  Take pain medicine only as directed by your caregiver.  Only take over-the-counter or prescription medicines for pain, discomfort, or fever as directed by your caregiver.  Try a clear liquid diet (broth, tea, or water) for as long as directed by your caregiver. Slowly move to a bland diet as tolerated. SEEK IMMEDIATE MEDICAL CARE IF:   The pain does not go away.  You have a fever.  You keep throwing up (vomiting).  The pain is felt only in portions of the abdomen. Pain in the right side could possibly be appendicitis. In an adult, pain in the left lower portion of the abdomen could be colitis or diverticulitis.  You pass bloody or black tarry stools. MAKE SURE YOU:   Understand these instructions.  Will watch your condition.  Will get help right away if you are not doing well or get worse. Document Released: 10/25/2004 Document Revised: 04/09/2011 Document Reviewed: 09/03/2007 [facility] Patient Information 2014 [place], [place].   Constipation, Adult Constipation is when a person has fewer than 3 bowel movements a week; has difficulty having a bowel movement; or has stools that are dry, hard, or larger than normal. As people grow older, constipation is more common. If you try to fix constipation with medicines that make you have a bowel movement (laxatives), the problem may get worse. Long-term laxative  use may cause the muscles of the colon to become weak. A low-fiber diet, not taking in enough fluids, and taking certain medicines may make constipation worse. CAUSES   Certain medicines, such as antidepressants, pain medicine, iron supplements, antacids, and water pills.   Certain diseases, such as diabetes, irritable bowel syndrome (IBS), thyroid disease, or depression.   Not drinking enough water.   Not eating enough fiber-rich foods.   Stress or travel.  Lack of physical activity or exercise.  Not going to the restroom when there is the urge to have a bowel movement.  Ignoring the urge to have a bowel movement.  Using laxatives too much. SYMPTOMS   Having fewer than 3 bowel movements a week.   Straining to have a bowel movement.   Having hard, dry, or larger than normal stools.   Feeling full or bloated.   Pain in the lower abdomen.  Not feeling relief after having a bowel movement. DIAGNOSIS  Your caregiver will take a medical history and perform a physical exam. Further testing may be done for severe constipation. Some tests may include:   A barium enema X-ray to examine your rectum, colon, and sometimes, your small intestine.  A sigmoidoscopy to examine your lower colon.  A colonoscopy to examine your entire colon. TREATMENT  Treatment will depend on the severity of your constipation and what is causing it. Some dietary treatments include drinking more fluids and eating more fiber-rich foods. Lifestyle treatments may include regular exercise. If these diet and lifestyle recommendations do not help, your  caregiver may recommend taking over-the-counter laxative medicines to help you have bowel movements. Prescription medicines may be prescribed if over-the-counter medicines do not work.  HOME CARE INSTRUCTIONS   Increase dietary fiber in your diet, such as fruits, vegetables, whole grains, and beans. Limit high-fat and processed sugars in your diet, such as  [place] fries, hamburgers, cookies, candies, and soda.   A fiber supplement may be added to your diet if you cannot get enough fiber from foods.   Drink enough fluids to keep your urine clear or pale yellow.   Exercise regularly or as directed by your caregiver.   Go to the restroom when you have the urge to go. Do not hold it.  Only take medicines as directed by your caregiver. Do not take other medicines for constipation without talking to your caregiver first. SEEK IMMEDIATE MEDICAL CARE IF:   You have bright red blood in your stool.   Your constipation lasts for more than 4 days or gets worse.   You have abdominal or rectal pain.   You have thin, pencil-like stools.  You have unexplained weight loss. MAKE SURE YOU:   Understand these instructions.  Will watch your condition.  Will get help right away if you are not doing well or get worse. Document Released: 10/14/2003 Document Revised: 04/09/2011 Document Reviewed: 12/19/2010 [facility] Patient Information 2014 [place], [place].  Miralax 17grams daily, for 1-4 days, until bowel movement  Increase fluid intake (water) Increase fiber in diet (fruits, vegetables, whole grains) Take stool softner daily

## 2012-11-17 NOTE — Telephone Encounter (Signed)
Appt given for today 

## 2012-11-20 ENCOUNTER — Telehealth: Payer: Self-pay | Admitting: General Practice

## 2012-11-20 NOTE — Telephone Encounter (Signed)
Patient would like to speak with you.

## 2012-11-20 NOTE — Telephone Encounter (Signed)
Spoke with patient and she reports having bowel movements daily, but still feels slightly full. Informed to continue miralax as directed, increase fluids (water), take stool softner and follow up with office on Friday (tomorrow). May seek emergency medical treatment if symptoms worsen. Patient verbalized understanding.

## 2012-12-04 ENCOUNTER — Other Ambulatory Visit: Payer: Self-pay

## 2012-12-15 ENCOUNTER — Encounter: Payer: Self-pay | Admitting: Gastroenterology

## 2012-12-15 ENCOUNTER — Encounter: Payer: Self-pay | Admitting: *Deleted

## 2012-12-18 ENCOUNTER — Other Ambulatory Visit (INDEPENDENT_AMBULATORY_CARE_PROVIDER_SITE_OTHER): Payer: Managed Care, Other (non HMO)

## 2012-12-18 ENCOUNTER — Encounter: Payer: Self-pay | Admitting: Gastroenterology

## 2012-12-18 ENCOUNTER — Ambulatory Visit (INDEPENDENT_AMBULATORY_CARE_PROVIDER_SITE_OTHER): Payer: Managed Care, Other (non HMO) | Admitting: Gastroenterology

## 2012-12-18 VITALS — BP 114/74 | HR 79 | Ht 64.0 in | Wt 211.0 lb

## 2012-12-18 DIAGNOSIS — K589 Irritable bowel syndrome without diarrhea: Secondary | ICD-10-CM

## 2012-12-18 DIAGNOSIS — K5900 Constipation, unspecified: Secondary | ICD-10-CM

## 2012-12-18 DIAGNOSIS — R1310 Dysphagia, unspecified: Secondary | ICD-10-CM

## 2012-12-18 DIAGNOSIS — R1013 Epigastric pain: Secondary | ICD-10-CM

## 2012-12-18 DIAGNOSIS — G8929 Other chronic pain: Secondary | ICD-10-CM

## 2012-12-18 LAB — BASIC METABOLIC PANEL
Chloride: 105 mEq/L (ref 96–112)
GFR: 63.66 mL/min (ref >60.00)
Glucose, Bld: 89 mg/dL (ref 70–99)
Potassium: 4.1 mEq/L (ref 3.5–5.1)
Sodium: 137 mEq/L (ref 135–145)

## 2012-12-18 LAB — CBC WITH DIFFERENTIAL/PLATELET
Eosinophils Absolute: 0.2 10*3/uL (ref 0.0–0.7)
Eosinophils Relative: 3.1 % (ref 0.0–5.0)
Lymphocytes Relative: 26.5 % (ref 12.0–46.0)
MCV: 82.6 fl (ref 78.0–100.0)
Monocytes Absolute: 0.5 10*3/uL (ref 0.1–1.0)
Monocytes Relative: 7.4 % (ref 3.0–12.0)
Neutrophils Relative %: 62.6 % (ref 43.0–77.0)
Platelets: 289 10*3/uL (ref 150.0–400.0)
RDW: 13.4 % (ref 11.5–14.6)
WBC: 6.7 10*3/uL (ref 4.5–10.5)

## 2012-12-18 LAB — HEPATIC FUNCTION PANEL
ALT: 20 U/L (ref 0–35)
AST: 21 U/L (ref 0–37)
Albumin: 4.2 g/dL (ref 3.5–5.2)
Alkaline Phosphatase: 55 U/L (ref 39–117)
Bilirubin, Direct: 0.1 mg/dL (ref 0.0–0.3)
Total Bilirubin: 0.7 mg/dL (ref 0.3–1.2)
Total Protein: 7.7 g/dL (ref 6.0–8.3)

## 2012-12-18 LAB — VITAMIN B12: Vitamin B-12: 439 pg/mL (ref 211–911)

## 2012-12-18 LAB — IBC PANEL
Iron: 65 ug/dL (ref 42–145)
Transferrin: 347.6 mg/dL (ref 212.0–360.0)

## 2012-12-18 LAB — AMYLASE: Amylase: 55 U/L (ref 27–131)

## 2012-12-18 LAB — TSH: TSH: 0.6 u[IU]/mL (ref 0.35–5.50)

## 2012-12-18 LAB — LIPASE: Lipase: 20 U/L (ref 11.0–59.0)

## 2012-12-18 MED ORDER — NA SULFATE-K SULFATE-MG SULF 17.5-3.13-1.6 GM/177ML PO SOLN: ORAL | Status: DC

## 2012-12-18 MED ORDER — OMEPRAZOLE 40 MG PO CPDR: 40.0000 mg | DELAYED_RELEASE_CAPSULE | Freq: Every day | ORAL | Status: DC

## 2012-12-18 MED ORDER — CILIDINIUM-CHLORDIAZEPOXIDE 2.5-5 MG PO CAPS: 1.0000 | ORAL_CAPSULE | Freq: Three times a day (TID) | ORAL | Status: DC

## 2012-12-18 NOTE — Progress Notes (Signed)
History of Present Illness:  This is a 42 year old Caucasian female who I previously saw in June of 9 because of NSAID gastritis and IBS diarrhea. She now complains of a year of constipation with hard stool every 3 days and abdominal gas, bloating, and flatus. Her main problem is epigastric abdominal pain which described as a sharp pain that occurs several times a day and does awaken her from sleep at night, slightly improved by over-the-counter H2 blockers and antacids. She was on heavy doses of NSAID but denies such since this summer. He also has some GERD symptoms and also intermittent solid food dysphagia. There no hepatobiliary complaints. Review of her labs does not show previous evaluation for celiac disease. Family history is noncontributory. She has been under a lot of personal stress recently which he thinks is exacerbated her gut problems. She denies abuse of alcohol, cigarettes, or NSAIDs currently. Her appetite has been good her weight is been stable. There no systemic complaints as his fever, chills, skin rash, joint pains. She's not had recent colonoscopies or radiographs.  I have reviewed this patient's present history, medical and surgical past history, allergies and medications.     ROS:   All systems were reviewed and are negative unless otherwise stated in the HPI.    Physical Exam: Healthy-appearing patient in no distress blood pressure 118/75, pulse 67 and regular and weight 211. General well developed well nourished patient in no acute distress, appearing their stated age Eyes PERRLA, no icterus, fundoscopic exam per opthamologist Skin no lesions noted Neck supple, no adenopathy, no thyroid enlargement, no tenderness Chest clear to percussion and auscultation Heart no significant murmurs, gallops or rubs noted Abdomen no hepatosplenomegaly masses or tenderness, BS normal.  Extremities no acute joint lesions, edema, phlebitis or evidence of cellulitis. Neurologic patient  oriented x 3, cranial nerves intact, no focal neurologic deficits noted. Psychological mental status normal and normal affect.  Assessment and plan: Her upper intestinal symptoms sound like peptic ulcer disease, rule out H. pylori infection. I placed her on Dexilant 60 mg a day pending endoscopic exam. She appears to have IBS with alternating diarrhea and constipation, all stress test abated. I placed her on when necessary Librax, liberal by mouth fluids, high fiber foods, and MiraLax at bedtime. He also sound like she may have an esophageal stricture may need esophageal dilatation. I have sent in by the lab today for lab review and celiac profile.

## 2012-12-18 NOTE — Patient Instructions (Signed)
You have been scheduled for an endoscopy and colonoscopy with propofol. Please follow the written instructions given to you at your visit today. Please pick up your prep at the pharmacy within the next 1-3 days. If you use inhalers (even only as needed), please bring them with you on the day of your procedure. Your physician has requested that you go to www.startemmi.com and enter the access code given to you at your visit today. This web site gives a general overview about your procedure. However, you should still follow specific instructions given to you by our office regarding your preparation for the procedure.  Please purchase Miralax over the counter and take one scoop ful and mix in 8 ounces of juice or water and drink at bedtime.  Omeprazole 40 mg was sent to your pharmacy, please take one capsule by mouth thirty minutes before breakfast once daily.   Your physician has requested that you go to the basement for the following lab work before leaving today: Anemia Panel  Liver Function Test  Celiac  Lipase Amylase BMP CBC  TSH

## 2012-12-19 ENCOUNTER — Ambulatory Visit (AMBULATORY_SURGERY_CENTER): Payer: Managed Care, Other (non HMO) | Admitting: Gastroenterology

## 2012-12-19 ENCOUNTER — Other Ambulatory Visit: Payer: Self-pay | Admitting: *Deleted

## 2012-12-19 ENCOUNTER — Other Ambulatory Visit: Payer: Self-pay

## 2012-12-19 ENCOUNTER — Encounter: Payer: Self-pay | Admitting: Gastroenterology

## 2012-12-19 VITALS — BP 110/60 | HR 76 | Temp 98.8°F | Resp 15 | Ht 64.0 in | Wt 211.0 lb

## 2012-12-19 DIAGNOSIS — R1310 Dysphagia, unspecified: Secondary | ICD-10-CM

## 2012-12-19 DIAGNOSIS — R1032 Left lower quadrant pain: Secondary | ICD-10-CM

## 2012-12-19 DIAGNOSIS — K5900 Constipation, unspecified: Secondary | ICD-10-CM

## 2012-12-19 DIAGNOSIS — R197 Diarrhea, unspecified: Secondary | ICD-10-CM

## 2012-12-19 DIAGNOSIS — R079 Chest pain, unspecified: Secondary | ICD-10-CM

## 2012-12-19 DIAGNOSIS — K2981 Duodenitis with bleeding: Secondary | ICD-10-CM

## 2012-12-19 DIAGNOSIS — Z1211 Encounter for screening for malignant neoplasm of colon: Secondary | ICD-10-CM

## 2012-12-19 DIAGNOSIS — D649 Anemia, unspecified: Secondary | ICD-10-CM

## 2012-12-19 LAB — CELIAC PANEL 10
Endomysial Screen: NEGATIVE
Tissue Transglut Ab: 10.2 U/mL (ref <20)
Tissue Transglutaminase Ab, IgA: 5.2 U/mL (ref <20)

## 2012-12-19 MED ORDER — SODIUM CHLORIDE 0.9 % IV SOLN: 500.0000 mL | INTRAVENOUS | Status: DC

## 2012-12-19 MED ORDER — POLYSACCHARIDE IRON COMPLEX 150 MG PO CAPS: 150.0000 mg | ORAL_CAPSULE | Freq: Every day | ORAL | Status: DC

## 2012-12-19 MED ORDER — TRAMADOL HCL 50 MG PO TABS: 50.0000 mg | ORAL_TABLET | Freq: Four times a day (QID) | ORAL | Status: DC | PRN

## 2012-12-19 NOTE — Op Note (Signed)
[facility] [address] [place], [address]   ENDOSCOPY PROCEDURE REPORT  PATIENT: Rhonda, Anthony  MR#: [id number] BIRTHDATE: 29-Jun-1970 , 42  yrs. old GENDER: Female ENDOSCOPIST:Nina Mondor Hale Bogus, MD, [facility] REFERRED BY: PROCEDURE DATE:  12/19/2012 PROCEDURE:   EGD w/ biopsy for H.pylori ASA CLASS:    Class II INDICATIONS: Epigastric pain. MEDICATION: There was residual sedation effect present from prior procedure and propofol (Diprivan) 150mg  IV TOPICAL ANESTHETIC:   Cetacaine Spray  DESCRIPTION OF PROCEDURE:   After the risks and benefits of the procedure were explained, informed consent was obtained.  The LB [id number]  endoscope was introduced through the mouth  and advanced to the second portion of the duodenum .  The instrument was slowly withdrawn as the mucosa was fully examined.      DUODENUM: Two ulcers ranging between 3-77mm in size were found in the duodenal bulb.  STOMACH: There was mild antral gastropathy noted. CLO Bx. done  ESOPHAGUS: The mucosa of the esophagus appeared normal. Retroflexed views revealed no abnormalities.    The scope was then withdrawn from the patient and the procedure completed.  COMPLICATIONS: There were no complications.   ENDOSCOPIC IMPRESSION: 1.   Two ulcers ranging between 3-56mm in size were found in the duodenal bulb ,,,probable NSAID ulcers,R/O H.pylori 2.   There was mild antral gastropathy noted [T2] 3.   The mucosa of the esophagus appeared normal  RECOMMENDATIONS: 1.  Await pathology results 2.  Continue PPI 3.  Rx CLO if positive    _______________________________ eSigned:  Mardella Layman, MD, [facility] 12/19/2012 4:10 PM      PATIENT NAME:  Rhonda Anthony MR#: [id number]

## 2012-12-19 NOTE — Op Note (Signed)
[facility] [address] [place], [address]   COLONOSCOPY PROCEDURE REPORT  PATIENT: Rhonda, Anthony  MR#: [id number] BIRTHDATE: 04-14-1970 , 42  yrs. old GENDER: Female ENDOSCOPIST: Mardella Layman, MD, [facility] REFERRED BY: PROCEDURE DATE:  12/19/2012 PROCEDURE:   Colonoscopy, screening First Screening Colonoscopy - Avg.  risk and is 50 yrs.  old or older - No.  Prior Negative Screening - Now for repeat screening. N/A  History of Adenoma - Now for follow-up colonoscopy & has been > or = to 3 yrs.  N/A ASA CLASS:   Class I INDICATIONS:Colorectal cancer screening and elevated risk screening.  MEDICATIONS: propofol (Diprivan) 350mg  IV  DESCRIPTION OF PROCEDURE:   After the risks benefits and alternatives of the procedure were thoroughly explained, informed consent was obtained.  A digital rectal exam revealed no abnormalities of the rectum.   The [id number]  endoscope was introduced through the anus and advanced to the   . No adverse events experienced.   The quality of the prep was excellent, using MoviPrep  The instrument was then slowly withdrawn as the colon was fully examined.      COLON FINDINGS: A normal appearing cecum, ileocecal valve, and appendiceal orifice were identified.  The ascending, hepatic flexure, transverse, splenic flexure, descending, sigmoid colon and rectum appeared unremarkable.  No polyps or cancers were seen. Retroflexed views revealed no abnormalities. The time to cecum=2 minutes 37 seconds.  Withdrawal time=9 minutes 5 seconds.  The scope was withdrawn and the procedure completed. COMPLICATIONS: There were no complications.  ENDOSCOPIC IMPRESSION: Normal colon ,,,hx. of chronic IBS  RECOMMENDATIONS: 1.  Repeat Colonoscopy in 5 years. 2.  Metamucil or benefiber 3.  Continue current medications   eSigned:  Mardella Layman, MD, [facility] 12/19/2012 4:04 PM   cc:

## 2012-12-19 NOTE — Patient Instructions (Signed)

## 2012-12-19 NOTE — Progress Notes (Signed)
Report to pacu rn, vss, bbs=clear 

## 2012-12-19 NOTE — Progress Notes (Signed)
Patient did not experience any of the following events: a burn prior to discharge; a fall within the facility; wrong site/side/patient/procedure/implant event; or a hospital transfer or hospital admission upon discharge from the facility. (G8907) Patient did not have preoperative order for IV antibiotic SSI prophylaxis. (G8918)  

## 2012-12-22 ENCOUNTER — Telehealth: Payer: Self-pay | Admitting: *Deleted

## 2012-12-22 LAB — HELICOBACTER PYLORI SCREEN-BIOPSY: UREASE: NEGATIVE

## 2012-12-22 NOTE — Telephone Encounter (Signed)
Message left

## 2012-12-23 ENCOUNTER — Encounter: Payer: Self-pay | Admitting: Gastroenterology

## 2013-01-15 ENCOUNTER — Encounter: Payer: Self-pay | Admitting: *Deleted

## 2013-01-15 ENCOUNTER — Other Ambulatory Visit: Payer: Self-pay | Admitting: Nurse Practitioner

## 2013-01-15 MED ORDER — OSELTAMIVIR PHOSPHATE 75 MG PO CAPS: 75.0000 mg | ORAL_CAPSULE | Freq: Every day | ORAL | Status: DC

## 2013-01-20 ENCOUNTER — Ambulatory Visit (INDEPENDENT_AMBULATORY_CARE_PROVIDER_SITE_OTHER): Payer: Managed Care, Other (non HMO) | Admitting: Gastroenterology

## 2013-01-20 ENCOUNTER — Encounter: Payer: Self-pay | Admitting: Gastroenterology

## 2013-01-20 VITALS — BP 116/72 | HR 67 | Ht 64.0 in | Wt 213.0 lb

## 2013-01-20 DIAGNOSIS — R1013 Epigastric pain: Secondary | ICD-10-CM

## 2013-01-20 DIAGNOSIS — K5900 Constipation, unspecified: Secondary | ICD-10-CM

## 2013-01-20 MED ORDER — OMEPRAZOLE 40 MG PO CPDR: 40.0000 mg | DELAYED_RELEASE_CAPSULE | Freq: Every day | ORAL | Status: DC

## 2013-01-20 NOTE — Patient Instructions (Addendum)
We have sent the following medications to your pharmacy for you to pick up at your convenience: Omeprazole 40 mg  Please follow up in one year or sooner if symptoms reoccur.

## 2013-01-20 NOTE — Progress Notes (Signed)
This is a 42 year old Caucasian female who was recently found to have peptic ulcer disease with a negative H. pylori exam with CLO test.  She currently is on omeprazole is asymptomatic late she takes an anti-inflammatory for headaches.  Her colonoscopy was unremarkable and she is completely asymptomatic on daily Metamucil and liberal by mouth fluids.  She denies a current active GI complaints.     Current Medications, Allergies, Past Medical History, Past Surgical History, Family History and Social History were reviewed in [employer] record.  ROS: All systems were reviewed and are negative unless otherwise stated in the HPI.          Physical Exam: Low pressure 116/72, pulse 67 and regular and weight 213 pounds with a BMI of 36.54.  I cannot appreciate stigmata of chronic liver disease.  Abdominal exam is entirely unremarkable without organomegaly, masses or tenderness.  Bowel sounds are normal.    Assessment and Plan: Peptic ulcer disease recently she probably was NSAIDs which have urged her to discontinue completely.  After 6 weeks of being pain-free she can discontinue her PPI medication since she has no chronic GERD symptoms.  Have advised to continue with a high fiber diet and daily Metamucil and liberal by mouth fluids.  We'll see her on when necessary basis in the future as needed.  She is to continue other meds as per primary care physician.

## 2013-08-24 ENCOUNTER — Ambulatory Visit (INDEPENDENT_AMBULATORY_CARE_PROVIDER_SITE_OTHER): Payer: Managed Care, Other (non HMO) | Admitting: Family Medicine

## 2013-08-24 VITALS — BP 118/74 | HR 74 | Temp 98.1°F | Ht 64.75 in | Wt 212.0 lb

## 2013-08-24 DIAGNOSIS — R350 Frequency of micturition: Secondary | ICD-10-CM

## 2013-08-24 DIAGNOSIS — N390 Urinary tract infection, site not specified: Secondary | ICD-10-CM

## 2013-08-24 DIAGNOSIS — R319 Hematuria, unspecified: Secondary | ICD-10-CM

## 2013-08-24 LAB — POCT UA - MICROSCOPIC ONLY
Bacteria, U Microscopic: NEGATIVE
Casts, Ur, LPF, POC: NEGATIVE
Crystals, Ur, HPF, POC: NEGATIVE
Mucus, UA: NEGATIVE
Yeast, UA: NEGATIVE

## 2013-08-24 LAB — POCT URINALYSIS DIPSTICK
Bilirubin, UA: NEGATIVE
Glucose, UA: NEGATIVE
Ketones, UA: NEGATIVE
Nitrite, UA: NEGATIVE
Spec Grav, UA: 1.01
Urobilinogen, UA: NEGATIVE
pH, UA: 7.5

## 2013-08-24 MED ORDER — CIPROFLOXACIN HCL 500 MG PO TABS: 500.0000 mg | ORAL_TABLET | Freq: Two times a day (BID) | ORAL | Status: DC

## 2013-08-24 MED ORDER — PHENAZOPYRIDINE HCL 200 MG PO TABS: 200.0000 mg | ORAL_TABLET | Freq: Three times a day (TID) | ORAL | Status: DC | PRN

## 2013-08-24 NOTE — Progress Notes (Signed)
   Subjective:    Patient ID: Rhonda Anthony, female    DOB: [birth year], 43 y.o.   MRN: [id number]  HPI C/o dysuria and urinary freq.   Review of Systems C/o urinary sx's   No chest pain, SOB, HA, dizziness, vision change, N/V, diarrhea, constipation, myalgias, arthralgias or rash.  Objective:   Physical Exam  Vital signs noted  Well developed well nourished female.  HEENT - Head atraumatic Normocephalic Respiratory - Lungs CTA bilateral Cardiac - RRR S1 and S2 without murmur GI - Abdomen tender in lower quadrant and bladder area     Results for orders placed in visit on 08/24/13  POCT URINALYSIS DIPSTICK      Result Value Ref Range   Color, UA straw     Clarity, UA cloudy     Glucose, UA neg     Bilirubin, UA neg     Ketones, UA neg     Spec Grav, UA 1.010     Blood, UA large     pH, UA 7.5     Protein, UA 1+     Urobilinogen, UA negative     Nitrite, UA neg     Leukocytes, UA moderate (2+)    POCT UA - MICROSCOPIC ONLY      Result Value Ref Range   WBC, Ur, HPF, POC 5-10     RBC, urine, microscopic 20-25     Bacteria, U Microscopic neg     Mucus, UA neg     Epithelial cells, urine per micros occ     Crystals, Ur, HPF, POC neg     Casts, Ur, LPF, POC neg     Yeast, UA neg     Assessment & Plan:  Urinary frequency - Plan: POCT urinalysis dipstick, POCT UA - Microscopic Only, ciprofloxacin (CIPRO) 500 MG tablet, phenazopyridine (PYRIDIUM) 200 MG tablet, Urine culture  Urinary tract infection with hematuria, site unspecified - Plan: ciprofloxacin (CIPRO) 500 MG tablet, phenazopyridine (PYRIDIUM) 200 MG tablet, Urine culture  Deatra CanterWilliam J Kriya Westra FNP

## 2013-08-26 LAB — URINE CULTURE

## 2014-06-07 ENCOUNTER — Encounter: Payer: Self-pay | Admitting: Gastroenterology

## 2014-07-16 ENCOUNTER — Encounter: Payer: Self-pay | Admitting: Family Medicine

## 2014-07-21 ENCOUNTER — Encounter: Payer: Self-pay | Admitting: Family Medicine

## 2014-07-21 ENCOUNTER — Ambulatory Visit (INDEPENDENT_AMBULATORY_CARE_PROVIDER_SITE_OTHER): Payer: Managed Care, Other (non HMO) | Admitting: Family Medicine

## 2014-07-21 VITALS — BP 117/66 | HR 66 | Temp 98.4°F | Ht 64.0 in | Wt 216.0 lb

## 2014-07-21 DIAGNOSIS — F418 Other specified anxiety disorders: Secondary | ICD-10-CM | POA: Diagnosis not present

## 2014-07-21 DIAGNOSIS — R0600 Dyspnea, unspecified: Secondary | ICD-10-CM

## 2014-07-21 DIAGNOSIS — F329 Major depressive disorder, single episode, unspecified: Secondary | ICD-10-CM

## 2014-07-21 DIAGNOSIS — R079 Chest pain, unspecified: Secondary | ICD-10-CM | POA: Diagnosis not present

## 2014-07-21 DIAGNOSIS — F419 Anxiety disorder, unspecified: Principal | ICD-10-CM

## 2014-07-21 LAB — POCT CBC
GRANULOCYTE PERCENT: 66.1 % (ref 37–80)
HCT, POC: 42.8 % (ref 37.7–47.9)
Hemoglobin: 13.8 g/dL (ref 12.2–16.2)
LYMPH, POC: 2.4 (ref 0.6–3.4)
MCH, POC: 28.7 pg (ref 27–31.2)
MCHC: 32.3 g/dL (ref 31.8–35.4)
MCV: 88.8 fL (ref 80–97)
MPV: 7.4 fL (ref 0–99.8)
PLATELET COUNT, POC: 339 10*3/uL (ref 142–424)
POC GRANULOCYTE: 5.3 (ref 2–6.9)
POC LYMPH PERCENT: 29.9 %L (ref 10–50)
RBC: 4.82 M/uL (ref 4.04–5.48)
RDW, POC: 12.6 %
WBC: 8 10*3/uL (ref 4.6–10.2)

## 2014-07-21 MED ORDER — DULOXETINE HCL 30 MG PO CPEP: 30.0000 mg | ORAL_CAPSULE | Freq: Every day | ORAL | Status: DC

## 2014-07-21 NOTE — Progress Notes (Signed)
Subjective:  Patient ID: Rhonda Anthony, female    DOB: 03/23/1970  Age: 44 y.o. MRN: [id number]  CC: Fatigue   HPI [name] presents for excessive tiredness. Fatigue as well. She's had some chest tightness and shortness of breath associated. 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 Mother-in-law has become smothering and overwhelming in her interest in business and presents frustration patient. Patient states there are some other issues that she did not want to go into today. She has been having some tearful episodes lost interest and motivation in usual activities. Difficulty making decisions. Since of dysphoria and feeling withdrawn.  History Altamese has a past medical history of GERD (gastroesophageal reflux disease); Internal hemorrhoids without mention of complication (1696); and Stomach ulcer.   She has past surgical history that includes Appendectomy; Abdominal hysterectomy; Eye surgery; Tubal ligation; Ectopic pregnancy surgery; Knee surgery (Left); Colonoscopy (2014); and Esophagogastroduodenoscopy (2014).   Her family history includes Anuerysm in her mother; Diabetes in an other family member. There is no history of Colon cancer, Esophageal cancer, or Stomach cancer.She reports that she has quit smoking. She has never used smokeless tobacco. She reports that she drinks alcohol. She reports that she does not use illicit drugs.  Outpatient Prescriptions Prior to Visit  Medication Sig Dispense Refill  . Cholecalciferol (VITAMIN D PO) Take 1 tablet by mouth daily.    . ciprofloxacin (CIPRO) 500 MG tablet Take 1 tablet (500 mg total) by mouth 2 (two) times daily. (Patient not taking: Reported on 07/21/2014) 14 tablet 0  . phenazopyridine (PYRIDIUM) 200 MG tablet Take 1 tablet (200 mg total) by mouth 3 (three) times daily as needed for pain. (Patient not taking: Reported on 07/21/2014) 10 tablet 0  . psyllium (METAMUCIL) 0.52 G  capsule Take 0.52 g by mouth daily.     No facility-administered medications prior to visit.    ROS Review of Systems  Constitutional: Negative for fever, chills, diaphoresis, appetite change, fatigue and unexpected weight change.  HENT: Negative for congestion, ear pain, hearing loss, postnasal drip, rhinorrhea, sneezing, sore throat and trouble swallowing.   Eyes: Negative for pain.  Respiratory: Negative for cough, chest tightness and shortness of breath.   Cardiovascular: Negative for chest pain and palpitations.  Gastrointestinal: Negative for nausea, vomiting, abdominal pain, diarrhea and constipation.  Genitourinary: Negative for dysuria, frequency and menstrual problem.  Musculoskeletal: Negative for joint swelling and arthralgias.  Skin: Negative for rash.  Neurological: Negative for dizziness, weakness, numbness and headaches.  Psychiatric/Behavioral: Positive for sleep disturbance and dysphoric mood. Negative for self-injury and agitation. The patient is nervous/anxious.     Objective:  BP 117/66 mmHg  Pulse 66  Temp(Src) 98.4 F (36.9 C) (Oral)  Ht _0  (1.626 m)  Wt 216 lb (97.977 kg)  BMI 37.06 kg/m2  BP Readings from Last 3 Encounters:  07/21/14 117/66  08/24/13 118/74  01/20/13 116/72    Wt Readings from Last 3 Encounters:  07/21/14 216 lb (97.977 kg)  08/24/13 212 lb (96.163 kg)  01/20/13 213 lb (96.616 kg)     Physical Exam  Constitutional: She is oriented to person, place, and time. She appears well-developed and well-nourished. No distress.  HENT:  Head: Normocephalic and atraumatic.  Right Ear: External ear normal.  Left Ear: External ear normal.  Nose: Nose normal.  Mouth/Throat: Oropharynx is clear and moist.  Eyes: Conjunctivae and EOM are normal. Pupils are equal, round, and reactive to light.  Neck: Normal  range of motion. Neck supple. No thyromegaly present.  Cardiovascular: Normal rate, regular rhythm and normal heart sounds.   No  murmur heard. Pulmonary/Chest: Effort normal and breath sounds normal. No respiratory distress. She has no wheezes. She has no rales.  Abdominal: Soft. Bowel sounds are normal. She exhibits no distension. There is no tenderness.  Lymphadenopathy:    She has no cervical adenopathy.  Neurological: She is alert and oriented to person, place, and time. She has normal reflexes.  Skin: Skin is warm and dry.  Psychiatric: She has a normal mood and affect. Her behavior is normal. Judgment and thought content normal.    No results found for: HGBA1C  Lab Results  Component Value Date   WBC 8.0 07/21/2014   HGB 13.8 07/21/2014   HCT 42.8 07/21/2014   PLT 289.0 12/18/2012   GLUCOSE 86 07/21/2014   ALT 21 07/21/2014   AST 21 07/21/2014   NA 138 07/21/2014   K 5.1 07/21/2014   CL 99 07/21/2014   CREATININE 1.09* 07/21/2014   BUN 14 07/21/2014   CO2 22 07/21/2014   TSH 0.269* 07/21/2014    Dg Chest 2 View  06/24/2012   *RADIOLOGY REPORT*  Clinical Data: Chest pain and chest tightness.  CHEST - 2 VIEW  Comparison: No priors.  Findings: Lung volumes are normal.  No consolidative airspace disease.  No pleural effusions.  No pneumothorax.  No pulmonary nodule or mass noted.  Pulmonary vasculature and the cardiomediastinal silhouette are within normal limits.  IMPRESSION: 1. No radiographic evidence of acute cardiopulmonary disease.   Original Report Authenticated By: Vinnie Langton, M.D.    Assessment & Plan:   Natalin was seen today for fatigue.  Diagnoses and all orders for this visit:  Anxiety and depression Orders: -     POCT CBC -     CMP14+EGFR -     TSH -     D-dimer, quantitative (not at [facility])  Chest pain, unspecified chest pain type Orders: -     POCT CBC -     CMP14+EGFR -     TSH -     D-dimer, quantitative (not at [facility])  Dyspnea Orders: -     POCT CBC -     CMP14+EGFR -     TSH -     D-dimer, quantitative (not at [facility])  Other orders -     DULoxetine (CYMBALTA)  30 MG capsule; Take 1 capsule (30 mg total) by mouth daily. For one week then two daily. Take with a full stomach at suppertime   I have discontinued Ms. Wiberg's psyllium, ciprofloxacin, and phenazopyridine. I am also having her start on DULoxetine. Additionally, I am having her maintain her Cholecalciferol (VITAMIN D PO) and cetirizine.  Meds ordered this encounter  Medications  . cetirizine (ZYRTEC) 10 MG tablet    Sig: Take 10 mg by mouth daily.  . DULoxetine (CYMBALTA) 30 MG capsule    Sig: Take 1 capsule (30 mg total) by mouth daily. For one week then two daily. Take with a full stomach at suppertime    Dispense:  60 capsule    Refill:  0     Follow-up: Return in about 1 month (around 08/20/2014).  Claretta Fraise, M.D.

## 2014-07-22 LAB — CMP14+EGFR
ALBUMIN: 4.6 g/dL (ref 3.5–5.5)
ALT: 21 IU/L (ref 0–32)
AST: 21 IU/L (ref 0–40)
Albumin/Globulin Ratio: 1.6 (ref 1.1–2.5)
Alkaline Phosphatase: 62 IU/L (ref 39–117)
BUN / CREAT RATIO: 13 (ref 9–23)
BUN: 14 mg/dL (ref 6–24)
Bilirubin Total: 0.4 mg/dL (ref 0.0–1.2)
CO2: 22 mmol/L (ref 18–29)
CREATININE: 1.09 mg/dL — AB (ref 0.57–1.00)
Calcium: 9.7 mg/dL (ref 8.7–10.2)
Chloride: 99 mmol/L (ref 97–108)
GFR, EST AFRICAN AMERICAN: 71 mL/min/{1.73_m2} (ref >59)
GFR, EST NON AFRICAN AMERICAN: 62 mL/min/{1.73_m2} (ref >59)
GLOBULIN, TOTAL: 2.8 g/dL (ref 1.5–4.5)
Glucose: 86 mg/dL (ref 65–99)
Potassium: 5.1 mmol/L (ref 3.5–5.2)
Sodium: 138 mmol/L (ref 134–144)
TOTAL PROTEIN: 7.4 g/dL (ref 6.0–8.5)

## 2014-07-22 LAB — TSH: TSH: 0.269 u[IU]/mL — AB (ref 0.450–4.500)

## 2014-07-22 LAB — D-DIMER, QUANTITATIVE (NOT AT ARMC): D-DIMER: 0.32 mg{FEU}/L (ref 0.00–0.49)

## 2014-07-28 LAB — THYROID PEROXIDASE ANTIBODY: Thyroperoxidase Ab SerPl-aCnc: 7 IU/mL (ref 0–34)

## 2014-07-28 LAB — T3, FREE: T3 FREE: 3.2 pg/mL (ref 2.0–4.4)

## 2014-07-28 LAB — SPECIMEN STATUS REPORT

## 2014-07-28 LAB — T4, FREE: FREE T4: 1.5 ng/dL (ref 0.82–1.77)

## 2014-08-18 ENCOUNTER — Ambulatory Visit (INDEPENDENT_AMBULATORY_CARE_PROVIDER_SITE_OTHER): Payer: Managed Care, Other (non HMO) | Admitting: Family Medicine

## 2014-08-18 ENCOUNTER — Encounter: Payer: Self-pay | Admitting: Family Medicine

## 2014-08-18 VITALS — BP 122/64 | HR 72 | Temp 98.3°F | Ht 64.0 in | Wt 215.0 lb

## 2014-08-18 DIAGNOSIS — F32A Depression, unspecified: Secondary | ICD-10-CM

## 2014-08-18 DIAGNOSIS — F329 Major depressive disorder, single episode, unspecified: Secondary | ICD-10-CM

## 2014-08-18 DIAGNOSIS — F418 Other specified anxiety disorders: Secondary | ICD-10-CM

## 2014-08-18 DIAGNOSIS — F419 Anxiety disorder, unspecified: Principal | ICD-10-CM

## 2014-08-18 MED ORDER — DULOXETINE HCL 60 MG PO CPEP: 60.0000 mg | ORAL_CAPSULE | Freq: Every day | ORAL | Status: DC

## 2014-08-18 NOTE — Progress Notes (Signed)
Subjective:  Patient ID: Rhonda Anthony, female    DOB: 04-30-1970  Age: 44 y.o. MRN: [id number]  CC: Follow-up   HPI PADEN KURAS presents for recheck of her anxiety and depression symptoms. She states that her outlook is dramatically improved in spite of lack of progression of her circumstances. She is cheerful and coping much better. She has a little bit of nausea. Sleep is still a bit of a challenge but overall doing much better.  History [name] has a past medical history of GERD (gastroesophageal reflux disease); Internal hemorrhoids without mention of complication (2009); and Stomach ulcer.   She has past surgical history that includes Appendectomy; Abdominal hysterectomy; Eye surgery; Tubal ligation; Ectopic pregnancy surgery; Knee surgery (Left); Colonoscopy (2014); and Esophagogastroduodenoscopy (2014).   Her family history includes Anuerysm in her mother; Diabetes in an other family member. There is no history of Colon cancer, Esophageal cancer, or Stomach cancer.She reports that she has quit smoking. She has never used smokeless tobacco. She reports that she drinks alcohol. She reports that she does not use illicit drugs.  Outpatient Prescriptions Prior to Visit  Medication Sig Dispense Refill  . cetirizine (ZYRTEC) 10 MG tablet Take 10 mg by mouth daily.    . Cholecalciferol (VITAMIN D PO) Take 1 tablet by mouth daily.    . DULoxetine (CYMBALTA) 30 MG capsule Take 1 capsule (30 mg total) by mouth daily. For one week then two daily. Take with a full stomach at suppertime 60 capsule 0   No facility-administered medications prior to visit.    ROS Review of Systems  Constitutional: Negative for fever, chills, diaphoresis, appetite change and fatigue.  HENT: Negative for congestion, ear pain, hearing loss, postnasal drip, rhinorrhea, sore throat and trouble swallowing.   Respiratory: Negative for cough, chest tightness and shortness of breath.   Cardiovascular:  Negative for chest pain and palpitations.  Gastrointestinal: Negative for abdominal pain.  Musculoskeletal: Negative for arthralgias.  Skin: Negative for rash.    Objective:  BP 122/64 mmHg  Pulse 72  Temp(Src) 98.3 F (36.8 C) (Oral)  Ht  (1.626 m)  Wt 215 lb (97.523 kg)  BMI 36.89 kg/m2  BP Readings from Last 3 Encounters:  08/18/14 122/64  07/21/14 117/66  08/24/13 118/74    Wt Readings from Last 3 Encounters:  08/18/14 215 lb (97.523 kg)  07/21/14 216 lb (97.977 kg)  08/24/13 212 lb (96.163 kg)     Physical Exam  Constitutional: She is oriented to person, place, and time. She appears well-developed and well-nourished. No distress.  HENT:  Head: Normocephalic and atraumatic.  Eyes: Conjunctivae and EOM are normal. Pupils are equal, round, and reactive to light.  Neck: Normal range of motion. Neck supple. No thyromegaly present.  Cardiovascular: Normal rate, regular rhythm and normal heart sounds.   No murmur heard. Pulmonary/Chest: Effort normal and breath sounds normal. No respiratory distress. She has no wheezes. She has no rales.  Abdominal: Soft.  Musculoskeletal: Normal range of motion.  Lymphadenopathy:    She has no cervical adenopathy.  Neurological: She is alert and oriented to person, place, and time. She has normal reflexes.  Skin: Skin is warm and dry.  Psychiatric: She has a normal mood and affect. Her behavior is normal. Judgment and thought content normal.    No results found for: HGBA1C  Lab Results  Component Value Date   WBC 8.0 07/21/2014   HGB 13.8 07/21/2014   HCT 42.8 07/21/2014   PLT 289.0  12/18/2012   GLUCOSE 86 07/21/2014   ALT 21 07/21/2014   AST 21 07/21/2014   NA 138 07/21/2014   K 5.1 07/21/2014   CL 99 07/21/2014   CREATININE 1.09* 07/21/2014   BUN 14 07/21/2014   CO2 22 07/21/2014   TSH 0.269* 07/21/2014    Dg Chest 2 View  06/24/2012   *RADIOLOGY REPORT*  Clinical Data: Chest pain and chest tightness.  CHEST -  2 VIEW  Comparison: No priors.  Findings: Lung volumes are normal.  No consolidative airspace disease.  No pleural effusions.  No pneumothorax.  No pulmonary nodule or mass noted.  Pulmonary vasculature and the cardiomediastinal silhouette are within normal limits.  IMPRESSION: 1. No radiographic evidence of acute cardiopulmonary disease.   Original Report Authenticated By: Trudie Reedaniel Entrikin, M.D.    Assessment & Plan:   Foye ClockKristina was seen today for follow-up.  Diagnoses and all orders for this visit:  Anxiety and depression  Other orders -     DULoxetine (CYMBALTA) 60 MG capsule; Take 1 capsule (60 mg total) by mouth daily.   I have changed Ms. Freeze's DULoxetine. I am also having her maintain her Cholecalciferol (VITAMIN D PO) and cetirizine.  Meds ordered this encounter  Medications  . DULoxetine (CYMBALTA) 60 MG capsule    Sig: Take 1 capsule (60 mg total) by mouth daily.    Dispense:  90 capsule    Refill:  1     Follow-up: Return in about 3 months (around 11/18/2014).  Mechele ClaudeWarren Angelo Caroll, M.D.

## 2014-11-30 ENCOUNTER — Ambulatory Visit (INDEPENDENT_AMBULATORY_CARE_PROVIDER_SITE_OTHER): Payer: Managed Care, Other (non HMO) | Admitting: Family Medicine

## 2014-11-30 ENCOUNTER — Encounter: Payer: Self-pay | Admitting: Family Medicine

## 2014-11-30 VITALS — BP 122/73 | HR 88 | Temp 98.4°F | Ht 64.0 in | Wt 214.8 lb

## 2014-11-30 DIAGNOSIS — R05 Cough: Secondary | ICD-10-CM | POA: Diagnosis not present

## 2014-11-30 DIAGNOSIS — R059 Cough, unspecified: Secondary | ICD-10-CM

## 2014-11-30 DIAGNOSIS — J40 Bronchitis, not specified as acute or chronic: Secondary | ICD-10-CM | POA: Diagnosis not present

## 2014-11-30 DIAGNOSIS — J029 Acute pharyngitis, unspecified: Secondary | ICD-10-CM

## 2014-11-30 DIAGNOSIS — J329 Chronic sinusitis, unspecified: Secondary | ICD-10-CM | POA: Diagnosis not present

## 2014-11-30 LAB — POCT INFLUENZA A/B
Influenza A, POC: NEGATIVE
Influenza B, POC: NEGATIVE

## 2014-11-30 LAB — POCT RAPID STREP A (OFFICE): Rapid Strep A Screen: NEGATIVE

## 2014-11-30 MED ORDER — AZITHROMYCIN 250 MG PO TABS: ORAL_TABLET | ORAL | Status: DC

## 2014-11-30 MED ORDER — HYDROCODONE-HOMATROPINE 5-1.5 MG/5ML PO SYRP: 5.0000 mL | ORAL_SOLUTION | Freq: Four times a day (QID) | ORAL | Status: DC | PRN

## 2014-11-30 NOTE — Patient Instructions (Signed)

## 2014-11-30 NOTE — Progress Notes (Signed)
Subjective:  Patient ID: Rhonda Anthony, female    DOB: 06-03-1970  Age: 44 y.o. MRN: [id number]  CC: Sore Throat; Cough; and Headache   HPI RADHIKA DERSHEM presents for Onset 4 days ago. Worse now. Burning. Cough started today. Scant sputum. Denies fever. DOE with going upstairs. HA mild, frontal X 2 days.   History Pearline has a past medical history of GERD (gastroesophageal reflux disease); Internal hemorrhoids without mention of complication (2009); and Stomach ulcer.   She has past surgical history that includes Appendectomy; Abdominal hysterectomy; Eye surgery; Tubal ligation; Ectopic pregnancy surgery; Knee surgery (Left); Colonoscopy (2014); and Esophagogastroduodenoscopy (2014).   Her family history includes Anuerysm in her mother; Diabetes in an other family member. There is no history of Colon cancer, Esophageal cancer, or Stomach cancer.She reports that she has quit smoking. She has never used smokeless tobacco. She reports that she drinks alcohol. She reports that she does not use illicit drugs.  Outpatient Prescriptions Prior to Visit  Medication Sig Dispense Refill  . cetirizine (ZYRTEC) 10 MG tablet Take 10 mg by mouth daily.    . Cholecalciferol (VITAMIN D PO) Take 1 tablet by mouth daily.    . DULoxetine (CYMBALTA) 60 MG capsule Take 1 capsule (60 mg total) by mouth daily. 90 capsule 1   No facility-administered medications prior to visit.    ROS Review of Systems  Constitutional: Negative for fever, chills, activity change and appetite change.  HENT: Positive for congestion and sinus pressure. Negative for ear discharge, ear pain, hearing loss, nosebleeds, postnasal drip, rhinorrhea, sneezing and trouble swallowing.   Respiratory: Positive for cough and shortness of breath. Negative for chest tightness and wheezing.   Cardiovascular: Negative for chest pain and palpitations.  Skin: Negative for rash.    Objective:  BP 122/73 mmHg  Pulse 88  Temp(Src)  98.4 F (36.9 C) (Oral)  Ht  (1.626 m)  Wt 214 lb 12.8 oz (97.433 kg)  BMI 36.85 kg/m2  BP Readings from Last 3 Encounters:  11/30/14 122/73  08/18/14 122/64  07/21/14 117/66    Wt Readings from Last 3 Encounters:  11/30/14 214 lb 12.8 oz (97.433 kg)  08/18/14 215 lb (97.523 kg)  07/21/14 216 lb (97.977 kg)     Physical Exam  No results found for: HGBA1C  Lab Results  Component Value Date   WBC 8.0 07/21/2014   HGB 13.8 07/21/2014   HCT 42.8 07/21/2014   PLT 289.0 12/18/2012   GLUCOSE 86 07/21/2014   ALT 21 07/21/2014   AST 21 07/21/2014   NA 138 07/21/2014   K 5.1 07/21/2014   CL 99 07/21/2014   CREATININE 1.09* 07/21/2014   BUN 14 07/21/2014   CO2 22 07/21/2014   TSH 0.269* 07/21/2014    Dg Chest 2 View  06/24/2012  *RADIOLOGY REPORT* Clinical Data: Chest pain and chest tightness. CHEST - 2 VIEW Comparison: No priors. Findings: Lung volumes are normal.  No consolidative airspace disease.  No pleural effusions.  No pneumothorax.  No pulmonary nodule or mass noted.  Pulmonary vasculature and the cardiomediastinal silhouette are within normal limits. IMPRESSION: 1. No radiographic evidence of acute cardiopulmonary disease. Original Report Authenticated By: Trudie Reed, M.D.    Assessment & Plan:   Amorah was seen today for sore throat, cough and headache.  Diagnoses and all orders for this visit:  Sinobronchitis  Sore throat -     POCT Influenza A/B -     POCT rapid strep A  Cough -     POCT Influenza A/B -     POCT rapid strep A  Other orders -     azithromycin (ZITHROMAX Z-PAK) 250 MG tablet; Take two right away Then one a day for the next 4 days. -     HYDROcodone-homatropine (HYCODAN) 5-1.5 MG/5ML syrup; Take 5 mLs by mouth every 6 (six) hours as needed for cough.   I am having Ms. Quinn PlowmanFrobeen start on azithromycin and HYDROcodone-homatropine. I am also having her maintain her Cholecalciferol (VITAMIN D PO), cetirizine, and  DULoxetine.  Meds ordered this encounter  Medications  . azithromycin (ZITHROMAX Z-PAK) 250 MG tablet    Sig: Take two right away Then one a day for the next 4 days.    Dispense:  6 each    Refill:  0  . HYDROcodone-homatropine (HYCODAN) 5-1.5 MG/5ML syrup    Sig: Take 5 mLs by mouth every 6 (six) hours as needed for cough.    Dispense:  120 mL    Refill:  0     Follow-up: No Follow-up on file.  Mechele ClaudeWarren Tesia Lybrand, M.D.

## 2014-12-29 ENCOUNTER — Telehealth: Payer: Self-pay | Admitting: Family Medicine

## 2014-12-29 NOTE — Telephone Encounter (Signed)
scheduled

## 2015-01-05 ENCOUNTER — Ambulatory Visit (INDEPENDENT_AMBULATORY_CARE_PROVIDER_SITE_OTHER): Payer: Managed Care, Other (non HMO)

## 2015-01-05 DIAGNOSIS — Z23 Encounter for immunization: Secondary | ICD-10-CM | POA: Diagnosis not present

## 2015-01-17 ENCOUNTER — Other Ambulatory Visit: Payer: Self-pay | Admitting: *Deleted

## 2015-01-17 MED ORDER — DULOXETINE HCL 60 MG PO CPEP: 60.0000 mg | ORAL_CAPSULE | Freq: Every day | ORAL | Status: DC

## 2015-01-25 ENCOUNTER — Ambulatory Visit (INDEPENDENT_AMBULATORY_CARE_PROVIDER_SITE_OTHER): Payer: Managed Care, Other (non HMO)

## 2015-01-25 ENCOUNTER — Ambulatory Visit (INDEPENDENT_AMBULATORY_CARE_PROVIDER_SITE_OTHER): Payer: Managed Care, Other (non HMO) | Admitting: Physician Assistant

## 2015-01-25 ENCOUNTER — Encounter: Payer: Self-pay | Admitting: Physician Assistant

## 2015-01-25 VITALS — BP 131/78 | HR 75 | Temp 98.6°F | Ht 64.0 in | Wt 220.0 lb

## 2015-01-25 DIAGNOSIS — M545 Low back pain, unspecified: Secondary | ICD-10-CM

## 2015-01-25 MED ORDER — MELOXICAM 15 MG PO TABS: 15.0000 mg | ORAL_TABLET | Freq: Every day | ORAL | Status: DC

## 2015-01-25 MED ORDER — CYCLOBENZAPRINE HCL 10 MG PO TABS: 10.0000 mg | ORAL_TABLET | Freq: Three times a day (TID) | ORAL | Status: DC | PRN

## 2015-01-25 NOTE — Patient Instructions (Signed)

## 2015-01-25 NOTE — Progress Notes (Signed)
Subjective:     Patient ID: Rhonda Anthony, female   DOB: [birth year], 44 y.o.   MRN: [id number]  HPI Pt with fall 5 days ago Pain to the midline of the lower back Denies any radicular pain or numbness  No hx of prev back surgery Using 800mg  Ibuprofen OTC  Review of Systems  Genitourinary: Negative.   Musculoskeletal: Positive for myalgias and back pain. Negative for joint swelling and gait problem.  Neurological: Negative.        Objective:   Physical Exam No ecchy/edema to the L-spine No palp muscle spasm + TTP midline L-spine and SI joints R>L FROM of the L-spine- increase in sx with flex/ext SLR neg Muscle strength distal good Sensory intact distal DTR 1+/= lower ext Xray-neg fx    Assessment:     Low Back Pain    Plan:     Heat/Ice Mobic Flexeril 10mg  tid prn #21 SE of meds reviewed Gentle stretching F/U prn

## 2015-04-20 ENCOUNTER — Other Ambulatory Visit: Payer: Self-pay | Admitting: Physician Assistant

## 2015-04-25 ENCOUNTER — Ambulatory Visit (INDEPENDENT_AMBULATORY_CARE_PROVIDER_SITE_OTHER): Payer: Managed Care, Other (non HMO) | Admitting: Family Medicine

## 2015-04-25 ENCOUNTER — Encounter: Payer: Self-pay | Admitting: Family Medicine

## 2015-04-25 VITALS — BP 132/70 | HR 84 | Temp 98.5°F | Ht 64.0 in | Wt 225.8 lb

## 2015-04-25 DIAGNOSIS — J019 Acute sinusitis, unspecified: Secondary | ICD-10-CM | POA: Diagnosis not present

## 2015-04-25 MED ORDER — MELOXICAM 15 MG PO TABS: ORAL_TABLET | ORAL | Status: DC

## 2015-04-25 MED ORDER — CEFDINIR 300 MG PO CAPS: 300.0000 mg | ORAL_CAPSULE | Freq: Two times a day (BID) | ORAL | Status: DC

## 2015-04-25 NOTE — Progress Notes (Signed)
BP 132/70 mmHg  Pulse 84  Temp(Src) 98.5 F (36.9 C) (Oral)  Ht  (1.626 m)  Wt 225 lb 12.8 oz (102.422 kg)  BMI 38.74 kg/m2   Subjective:    Patient ID: Rhonda Anthony, female    DOB: October 10, 1970, 45 y.o.   MRN: [id number]  HPI: Rhonda Anthony is a 45 y.o. female presenting on 04/25/2015 for Sinusitis and Cough   HPI Sinus congestion and cough Patient is coming in today for a 3 day history of sinus congestion and cough and postnasal drainage. Her cough is been productive of yellow-green sputum and has been worse at night. She denies any fevers or chills or shortness of breath or wheezing. She has been using some [employer] brand nasal spray that she does not know if for sure if it is Flonase or not. It has been helping some but not completely. She denies any sick contacts that she knows of. She feels like her cough is been worsening over the past few days.  Relevant past medical, surgical, family and social history reviewed and updated as indicated. Interim medical history since our last visit reviewed. Allergies and medications reviewed and updated.  Review of Systems  Constitutional: Negative for fever and chills.  HENT: Positive for congestion, postnasal drip, rhinorrhea, sinus pressure, sneezing and sore throat. Negative for ear discharge and ear pain.   Eyes: Negative for pain, redness and visual disturbance.  Respiratory: Negative for chest tightness and shortness of breath.   Cardiovascular: Negative for chest pain and leg swelling.  Genitourinary: Negative for dysuria and difficulty urinating.  Musculoskeletal: Negative for back pain and gait problem.  Skin: Negative for rash.  Neurological: Negative for light-headedness and headaches.  Psychiatric/Behavioral: Negative for behavioral problems and agitation.  All other systems reviewed and are negative.   Per HPI unless specifically indicated above     Medication List       This list is accurate as of:  04/25/15  4:52 PM.  Always use your most recent med list.               cefdinir 300 MG capsule  Commonly known as:  OMNICEF  Take 1 capsule (300 mg total) by mouth 2 (two) times daily. 1 po BID     cyclobenzaprine 10 MG tablet  Commonly known as:  FLEXERIL  Take 1 tablet (10 mg total) by mouth 3 (three) times daily as needed for muscle spasms.     DULoxetine 60 MG capsule  Commonly known as:  CYMBALTA  Take 1 capsule (60 mg total) by mouth daily.     fluticasone 50 MCG/ACT nasal spray  Commonly known as:  FLONASE  Place 1 spray into both nostrils daily.     meloxicam 15 MG tablet  Commonly known as:  MOBIC  TAKE ONE (1) TABLET EACH DAY           Objective:    BP 132/70 mmHg  Pulse 84  Temp(Src) 98.5 F (36.9 C) (Oral)  Ht  (1.626 m)  Wt 225 lb 12.8 oz (102.422 kg)  BMI 38.74 kg/m2  Wt Readings from Last 3 Encounters:  04/25/15 225 lb 12.8 oz (102.422 kg)  01/25/15 220 lb (99.791 kg)  11/30/14 214 lb 12.8 oz (97.433 kg)    Physical Exam  Constitutional: She is oriented to person, place, and time. She appears well-developed and well-nourished. No distress.  HENT:  Right Ear: Tympanic membrane, external ear and ear canal normal.  Left Ear: Tympanic membrane, external ear and ear canal normal.  Nose: Mucosal edema and rhinorrhea present. No epistaxis. Right sinus exhibits no maxillary sinus tenderness and no frontal sinus tenderness. Left sinus exhibits no maxillary sinus tenderness and no frontal sinus tenderness.  Mouth/Throat: Uvula is midline and mucous membranes are normal. Posterior oropharyngeal edema and posterior oropharyngeal erythema present. No oropharyngeal exudate or tonsillar abscesses.  Eyes: Conjunctivae and EOM are normal.  Neck: Neck supple. No thyromegaly present.  Cardiovascular: Normal rate, regular rhythm, normal heart sounds and intact distal pulses.   No murmur heard. Pulmonary/Chest: Effort normal and breath sounds normal. No  respiratory distress. She has no wheezes. She has no rales.  Musculoskeletal: Normal range of motion. She exhibits no edema or tenderness.  Lymphadenopathy:    She has no cervical adenopathy.  Neurological: She is alert and oriented to person, place, and time. Coordination normal.  Skin: Skin is warm and dry. No rash noted. She is not diaphoretic.  Psychiatric: She has a normal mood and affect. Her behavior is normal.  Nursing note and vitals reviewed.     Assessment & Plan:   Problem List Items Addressed This Visit    None    Visit Diagnoses    Acute rhinosinusitis    -  Primary    Will do conservative measures such as Flonase and Mucinex and nasal saline and an antihistamine, if not improved in 3-4 days will pick up Z-Pak    Relevant Medications    fluticasone (FLONASE) 50 MCG/ACT nasal spray    cefdinir (OMNICEF) 300 MG capsule       Follow up plan: Return if symptoms worsen or fail to improve.  Counseling provided for all of the vaccine components No orders of the defined types were placed in this encounter.    Arville CareJoshua Melika Reder, MD [facility], [facility], [facility] 04/25/2015, 4:52 PM

## 2015-04-29 ENCOUNTER — Encounter: Payer: Self-pay | Admitting: Pediatrics

## 2015-04-29 ENCOUNTER — Ambulatory Visit (INDEPENDENT_AMBULATORY_CARE_PROVIDER_SITE_OTHER): Payer: Managed Care, Other (non HMO) | Admitting: Pediatrics

## 2015-04-29 VITALS — BP 130/72 | HR 82 | Temp 98.4°F | Ht 64.0 in | Wt 225.4 lb

## 2015-04-29 DIAGNOSIS — Z124 Encounter for screening for malignant neoplasm of cervix: Secondary | ICD-10-CM

## 2015-04-29 DIAGNOSIS — J069 Acute upper respiratory infection, unspecified: Secondary | ICD-10-CM

## 2015-04-29 DIAGNOSIS — Z6838 Body mass index (BMI) 38.0-38.9, adult: Secondary | ICD-10-CM | POA: Diagnosis not present

## 2015-04-29 NOTE — Progress Notes (Signed)
    Subjective:    Patient ID: Rhonda Anthony, female    DOB: [birth year], 45 y.o.   MRN: [id number]  CC: Gynecologic Exam   HPI: Rhonda Anthony is a 45 y.o. female presenting for Gynecologic Exam  Most recent pap smears have been normal Hysterectomy in 2011 for heavy bleeding, no cancers or abnormal Not sure if she still has a cervix  Not regularly exercising Interested in losing weight  Relevant past medical, surgical, family and social history reviewed and updated as indicated. Interim medical history since our last visit reviewed. Allergies and medications reviewed and updated.  ROS: Per HPI unless specifically indicated above  History  Smoking status  . Former Smoker  Smokeless tobacco  . Never Used       Objective:    BP 130/72 mmHg  Pulse 82  Temp(Src) 98.4 F (36.9 C) (Oral)  Ht 5\' 4"  (1.626 m)  Wt 225 lb 6.4 oz (102.241 kg)  BMI 38.67 kg/m2  Wt Readings from Last 3 Encounters:  04/29/15 225 lb 6.4 oz (102.241 kg)  04/25/15 225 lb 12.8 oz (102.422 kg)  01/25/15 220 lb (99.791 kg)    Gen: NAD, alert, cooperative with exam, NCAT EYES: EOMI, no scleral injection or icterus ENT:  TMs dull gray b/l, OP without erythema CV: NRRR, normal S1/S2, no murmur, distal pulses 2+ b/l Resp: CTABL, no wheezes, normal WOB Abd: +BS, soft, NTND. no guarding or organomegaly Ext: No edema, warm Neuro: Alert and oriented, strength equal b/l UE and LE, coordination grossly normal GU: normal external female genitalia, normal vaginal ruggae, no cervix     Assessment & Plan:    Rhonda Anthony was seen today for gynecologic exam.  Diagnoses and all orders for this visit:  Cervical cancer screening If normal pap smear, can stop pap smear screening as pt s/p hysterectomy, no cervix present -     Pap IG and HPV (high risk) DNA detection  BMI 38.0-38.9,adult Discussed lifestyle changes, continue  Acute URI Improving     Follow up plan: Return if symptoms worsen or  fail to improve.  Rex Krasarol Alyiah Ulloa, MD [facility] 04/29/2015, 11:07 AM

## 2015-04-30 DIAGNOSIS — Z6838 Body mass index (BMI) 38.0-38.9, adult: Secondary | ICD-10-CM | POA: Insufficient documentation

## 2015-05-02 ENCOUNTER — Other Ambulatory Visit: Payer: Self-pay | Admitting: Family Medicine

## 2015-05-03 LAB — PAP IG AND HPV HIGH-RISK
HPV, HIGH-RISK: NEGATIVE
PAP SMEAR COMMENT: 0

## 2015-07-27 ENCOUNTER — Other Ambulatory Visit: Payer: Self-pay | Admitting: *Deleted

## 2015-07-27 MED ORDER — DULOXETINE HCL 60 MG PO CPEP: 60.0000 mg | ORAL_CAPSULE | Freq: Every day | ORAL | Status: DC

## 2015-10-07 ENCOUNTER — Other Ambulatory Visit: Payer: Self-pay | Admitting: Family Medicine

## 2015-10-10 ENCOUNTER — Other Ambulatory Visit: Payer: Self-pay

## 2015-10-10 MED ORDER — DULOXETINE HCL 60 MG PO CPEP: 60.0000 mg | ORAL_CAPSULE | Freq: Every day | ORAL | 0 refills | Status: DC

## 2015-10-17 ENCOUNTER — Other Ambulatory Visit: Payer: Self-pay | Admitting: *Deleted

## 2015-10-17 MED ORDER — DULOXETINE HCL 60 MG PO CPEP: 60.0000 mg | ORAL_CAPSULE | Freq: Every day | ORAL | 0 refills | Status: DC

## 2015-11-10 ENCOUNTER — Ambulatory Visit (INDEPENDENT_AMBULATORY_CARE_PROVIDER_SITE_OTHER): Payer: Managed Care, Other (non HMO) | Admitting: Family Medicine

## 2015-11-10 ENCOUNTER — Encounter: Payer: Self-pay | Admitting: Family Medicine

## 2015-11-10 VITALS — BP 127/80 | HR 77 | Temp 98.2°F | Ht 64.0 in | Wt 226.0 lb

## 2015-11-10 DIAGNOSIS — J020 Streptococcal pharyngitis: Secondary | ICD-10-CM | POA: Diagnosis not present

## 2015-11-10 LAB — RAPID STREP SCREEN (MED CTR MEBANE ONLY): STREP GP A AG, IA W/REFLEX: POSITIVE — AB

## 2015-11-10 MED ORDER — AMOXICILLIN 500 MG PO TABS: 500.0000 mg | ORAL_TABLET | Freq: Two times a day (BID) | ORAL | 0 refills | Status: DC

## 2015-11-10 NOTE — Progress Notes (Signed)
BP 127/80 (BP Location: Left Wrist, Patient Position: Sitting, Cuff Size: Small)   Pulse 77   Temp 98.2 F (36.8 C) (Oral)   Ht 5\' 4"  (1.626 m)   Wt 226 lb (102.5 kg)   BMI 38.79 kg/m    Subjective:    Patient ID: Rhonda Anthony, female    DOB: Aug 24, 1970, 45 y.o.   MRN: [id number]  HPI: Rhonda Anthony is a 45 y.o. female presenting on 11/10/2015 for Sore Throat (x 1 week. Has some post nasal drainage and a runny nose. Pain is worse in the morning. Has not taken anything OTC.)   HPI Sore throat Patient has had sore throat has been going on for the past week. She initially started out with some postnasal drainage and runny nose but most of that cleared up but the sore throat has remained. She denies any cough or wheezing or shortness of breath. She denies any fevers or chills but her sore throat just feels swollen and pain on either side.  Relevant past medical, surgical, family and social history reviewed and updated as indicated. Interim medical history since our last visit reviewed. Allergies and medications reviewed and updated.  Review of Systems  Constitutional: Negative for chills and fever.  HENT: Positive for postnasal drip, rhinorrhea, sinus pressure and sore throat. Negative for congestion, ear discharge, ear pain and sneezing.   Eyes: Negative for pain, redness and visual disturbance.  Respiratory: Negative for cough, chest tightness and shortness of breath.   Cardiovascular: Negative for chest pain and leg swelling.  Genitourinary: Negative for difficulty urinating and dysuria.  Musculoskeletal: Negative for back pain and gait problem.  Skin: Negative for rash.  Neurological: Negative for light-headedness and headaches.  Psychiatric/Behavioral: Negative for agitation and behavioral problems.  All other systems reviewed and are negative.   Per HPI unless specifically indicated above     Medication List       Accurate as of 11/10/15  6:53 PM. Always  use your most recent med list.          amoxicillin 500 MG tablet Commonly known as:  AMOXIL Take 1 tablet (500 mg total) by mouth 2 (two) times daily.   DULoxetine 60 MG capsule Commonly known as:  CYMBALTA Take 1 capsule (60 mg total) by mouth daily.          Objective:    BP 127/80 (BP Location: Left Wrist, Patient Position: Sitting, Cuff Size: Small)   Pulse 77   Temp 98.2 F (36.8 C) (Oral)   Ht 5\' 4"  (1.626 m)   Wt 226 lb (102.5 kg)   BMI 38.79 kg/m   Wt Readings from Last 3 Encounters:  11/10/15 226 lb (102.5 kg)  04/29/15 225 lb 6.4 oz (102.2 kg)  04/25/15 225 lb 12.8 oz (102.4 kg)    Physical Exam  Constitutional: She is oriented to person, place, and time. She appears well-developed and well-nourished. No distress.  HENT:  Right Ear: Tympanic membrane, external ear and ear canal normal.  Left Ear: Tympanic membrane, external ear and ear canal normal.  Nose: Mucosal edema and rhinorrhea present. No epistaxis. Right sinus exhibits no maxillary sinus tenderness and no frontal sinus tenderness. Left sinus exhibits no maxillary sinus tenderness and no frontal sinus tenderness.  Mouth/Throat: Uvula is midline and mucous membranes are normal. Posterior oropharyngeal edema and posterior oropharyngeal erythema present. No oropharyngeal exudate or tonsillar abscesses.  Eyes: Conjunctivae and EOM are normal.  Cardiovascular: Normal rate, regular rhythm, normal  heart sounds and intact distal pulses.   No murmur heard. Pulmonary/Chest: Effort normal and breath sounds normal. No respiratory distress. She has no wheezes.  Musculoskeletal: Normal range of motion. She exhibits no edema or tenderness.  Neurological: She is alert and oriented to person, place, and time. Coordination normal.  Skin: Skin is warm and dry. No rash noted. She is not diaphoretic.  Psychiatric: She has a normal mood and affect. Her behavior is normal.  Vitals reviewed.   Strep: Positive      Assessment & Plan:   Problem List Items Addressed This Visit    None    Visit Diagnoses    Acute streptococcal pharyngitis    -  Primary   Relevant Medications   amoxicillin (AMOXIL) 500 MG tablet   Other Relevant Orders   Rapid strep screen (not at [facility])       Follow up plan: Return if symptoms worsen or fail to improve.  Counseling provided for all of the vaccine components Orders Placed This Encounter  Procedures  . Rapid strep screen (not at [facility])    Arville CareJoshua Dettinger, MD [facility] 11/10/2015, 6:53 PM

## 2015-12-15 ENCOUNTER — Ambulatory Visit (INDEPENDENT_AMBULATORY_CARE_PROVIDER_SITE_OTHER): Payer: Managed Care, Other (non HMO)

## 2015-12-15 DIAGNOSIS — Z23 Encounter for immunization: Secondary | ICD-10-CM

## 2016-01-30 ENCOUNTER — Other Ambulatory Visit: Payer: Self-pay | Admitting: Pediatrics

## 2016-02-21 ENCOUNTER — Telehealth: Payer: Self-pay | Admitting: Family Medicine

## 2016-02-21 MED ORDER — DULOXETINE HCL 60 MG PO CPEP: 60.0000 mg | ORAL_CAPSULE | Freq: Every day | ORAL | 0 refills | Status: DC

## 2016-02-21 NOTE — Telephone Encounter (Signed)
Pt aware to be seen for CPE on or after 3/31 - with Dr Oswaldo DoneVincent

## 2016-05-04 ENCOUNTER — Other Ambulatory Visit: Payer: Self-pay | Admitting: Pediatrics

## 2016-05-18 ENCOUNTER — Other Ambulatory Visit: Payer: Self-pay | Admitting: Pediatrics

## 2016-08-02 ENCOUNTER — Other Ambulatory Visit: Payer: Self-pay | Admitting: Family Medicine

## 2016-08-03 NOTE — Telephone Encounter (Signed)
Last seen 11/10/15 Dr Dettinger  Dr Darlyn ReadStacks PCP

## 2016-10-25 ENCOUNTER — Other Ambulatory Visit: Payer: Self-pay | Admitting: Family Medicine

## 2016-11-07 ENCOUNTER — Ambulatory Visit (INDEPENDENT_AMBULATORY_CARE_PROVIDER_SITE_OTHER): Payer: Managed Care, Other (non HMO) | Admitting: Family Medicine

## 2016-11-07 ENCOUNTER — Encounter: Payer: Self-pay | Admitting: Family Medicine

## 2016-11-07 VITALS — BP 117/76 | HR 83 | Temp 98.3°F | Ht 64.0 in | Wt 230.0 lb

## 2016-11-07 DIAGNOSIS — E669 Obesity, unspecified: Secondary | ICD-10-CM | POA: Diagnosis not present

## 2016-11-07 DIAGNOSIS — Z23 Encounter for immunization: Secondary | ICD-10-CM | POA: Diagnosis not present

## 2016-11-07 DIAGNOSIS — F411 Generalized anxiety disorder: Secondary | ICD-10-CM

## 2016-11-07 MED ORDER — DULOXETINE HCL 60 MG PO CPEP: 60.0000 mg | ORAL_CAPSULE | Freq: Every day | ORAL | 1 refills | Status: DC

## 2016-11-07 MED ORDER — PHENTERMINE HCL 37.5 MG PO CAPS: 37.5000 mg | ORAL_CAPSULE | ORAL | 2 refills | Status: DC

## 2016-11-07 NOTE — Progress Notes (Addendum)
Subjective:  Patient ID: Rhonda Anthony, female    DOB: 19-Dec-1970  Age: 46 y.o. MRN: [id number]  CC: Medication Refill (pt here today for refills on her Cymbalta)   HPI Rhonda Anthony presents for Concerned about staying on medication but mentions that she has a great deal of anxiety because her mother-in-law wants to come live with her. She doesn't think that she or her husband can go along with that. In fact her sister-in-law was asked and told her mother no. Additionally the holidays are coming up and that can cause some stress. Her son also recently broke up with his girlfriend and they have a child. This is causing stress as well. After this discussion patient is not desiring to go off medication after all.  Patient is a stress eater. She's been on quite a bit of weight at least recently should like to have some help with that. She's been told there is medication that can help she's like to try something. She is in the process of contemplating a diet program.  Depression screen [facility] 2/9 11/07/2016 04/25/2015 08/18/2014  Decreased Interest 0 0 0  Down, Depressed, Hopeless 0 0 0  PHQ - 2 Score 0 0 0  Altered sleeping - - -  Tired, decreased energy - - -  Change in appetite - - -  Feeling bad or failure about yourself  - - -  Trouble concentrating - - -  Moving slowly or fidgety/restless - - -  Suicidal thoughts - - -  PHQ-9 Score - - -    History Rhonda Anthony has a past medical history of GERD (gastroesophageal reflux disease); Internal hemorrhoids without mention of complication (2009); and Stomach ulcer.   She has a past surgical history that includes Appendectomy; Abdominal hysterectomy; Eye surgery; Tubal ligation; Ectopic pregnancy surgery; Knee surgery (Left); Colonoscopy (2014); and Esophagogastroduodenoscopy (2014).   Her family history includes Rhonda Anthony in her mother; Diabetes in her unknown relative.She reports that she has quit smoking. She has never used smokeless  tobacco. She reports that she drinks alcohol. She reports that she does not use drugs.    ROS Review of Systems  Constitutional: Negative for activity change, appetite change and fever.  HENT: Negative for congestion, rhinorrhea and sore throat.   Eyes: Negative for visual disturbance.  Respiratory: Negative for cough and shortness of breath.   Cardiovascular: Negative for chest pain and palpitations.  Gastrointestinal: Negative for abdominal pain, diarrhea and nausea.  Genitourinary: Negative for dysuria.  Musculoskeletal: Negative for arthralgias and myalgias.    Objective:  BP 117/76   Pulse 83   Temp 98.3 F (36.8 C) (Oral)   Ht  (1.626 m)   Wt 230 lb (104.3 kg)   BMI 39.48 kg/m   BP Readings from Last 3 Encounters:  11/07/16 117/76  11/10/15 127/80  04/29/15 130/72    Wt Readings from Last 3 Encounters:  11/07/16 230 lb (104.3 kg)  11/10/15 226 lb (102.5 kg)  04/29/15 225 lb 6.4 oz (102.2 kg)     Physical Exam  Constitutional: She is oriented to person, place, and time. She appears well-developed and well-nourished. No distress.  HENT:  Head: Normocephalic and atraumatic.  Right Ear: External ear normal.  Left Ear: External ear normal.  Nose: Nose normal.  Mouth/Throat: Oropharynx is clear and moist.  Eyes: Pupils are equal, round, and reactive to light. Conjunctivae and EOM are normal.  Neck: Normal range of motion. Neck supple. No thyromegaly present.  Cardiovascular:  Normal rate, regular rhythm and normal heart sounds.   No murmur heard. Pulmonary/Chest: Effort normal and breath sounds normal. No respiratory distress. She has no wheezes. She has no rales.  Abdominal: Soft. Bowel sounds are normal. She exhibits no distension. There is no tenderness.  Lymphadenopathy:    She has no cervical adenopathy.  Neurological: She is alert and oriented to person, place, and time. She has normal reflexes.  Skin: Skin is warm and dry.  Psychiatric: She has a  normal mood and affect. Her behavior is normal. Judgment and thought content normal.      Assessment & Plan:   Jermaine was seen today for medication refill.  Diagnoses and all orders for this visit:  GAD (generalized anxiety disorder)  Obesity (BMI 35.0-39.9 without comorbidity)  Need for immunization against influenza -     Flu Vaccine QUAD 36+ mos IM  Other orders -     DULoxetine (CYMBALTA) 60 MG capsule; Take 1 capsule (60 mg total) by mouth daily. -     phentermine 37.5 MG capsule; Take 1 capsule (37.5 mg total) by mouth every morning.       I have discontinued Ms. Patella's amoxicillin. I have also changed her DULoxetine. Additionally, I am having her start on phentermine.  Allergies as of 11/07/2016      Reactions   Biaxin [clarithromycin]    unknown   Demerol [meperidine]    unknown      Medication List       Accurate as of 11/07/16  9:17 AM. Always use your most recent med list.          DULoxetine 60 MG capsule Commonly known as:  CYMBALTA Take 1 capsule (60 mg total) by mouth daily.   phentermine 37.5 MG capsule Take 1 capsule (37.5 mg total) by mouth every morning.     We discussed in detail the pros and cons of this medication duloxetine. She denies any seasonal affective disorder symptoms in the past. However the situations with her family are coming to a head and through the holidays she realizes this may not be the best time to taper off the duloxetine.  We also discussed the need for her to have a planned diet not just to go on the phentermine and expect weight loss. Before filling the medicine she was advised that she should choose a meal program.    Follow-up: Return in about 3 months (around 02/07/2017).  Mechele Claude, M.D.

## 2016-12-17 ENCOUNTER — Encounter: Payer: Self-pay | Admitting: Family Medicine

## 2016-12-17 ENCOUNTER — Ambulatory Visit: Payer: Managed Care, Other (non HMO) | Admitting: Family Medicine

## 2016-12-17 VITALS — BP 137/76 | HR 78 | Temp 98.1°F | Ht 64.0 in | Wt 219.0 lb

## 2016-12-17 DIAGNOSIS — J329 Chronic sinusitis, unspecified: Secondary | ICD-10-CM

## 2016-12-17 DIAGNOSIS — J40 Bronchitis, not specified as acute or chronic: Secondary | ICD-10-CM

## 2016-12-17 MED ORDER — PSEUDOEPHEDRINE-GUAIFENESIN ER 120-1200 MG PO TB12: 1.0000 | ORAL_TABLET | Freq: Two times a day (BID) | ORAL | 0 refills | Status: DC

## 2016-12-17 MED ORDER — AMOXICILLIN-POT CLAVULANATE 875-125 MG PO TABS: 1.0000 | ORAL_TABLET | Freq: Two times a day (BID) | ORAL | 0 refills | Status: DC

## 2016-12-17 NOTE — Progress Notes (Signed)
Chief Complaint  Patient presents with  . Cough    pt here today c/o cough x 4 days    HPI  Patient presents today for Patient presents with upper respiratory congestion. Rhinorrhea that is frequently purulent. There is moderate sore throat. Patient reports coughing frequently as well.  yellow sputum noted. There is no fever, chills or sweats. The patient denies being short of breath. Onset was 3-5 days ago. Gradually worsening. Tried OTCs without improvement.  PMH: Smoking status noted ROS: Per HPI  Objective: BP 137/76   Pulse 78   Temp 98.1 F (36.7 C) (Oral)   Ht 5\' 4"  (1.626 m)   Wt 219 lb (99.3 kg)   BMI 37.59 kg/m  Gen: NAD, alert, cooperative with exam HEENT: NCAT, Nasal passages swollen, red TMS RED CV: RRR, good S1/S2, no murmur Resp: Bronchitis changes with scattered wheezes, non-labored Ext: No edema, warm Neuro: Alert and oriented, No gross deficits  Assessment and plan:  1. Sinobronchitis     Meds ordered this encounter  Medications  . amoxicillin-clavulanate (AUGMENTIN) 875-125 MG tablet    Sig: Take 1 tablet 2 (two) times daily by mouth. Take all of this medication    Dispense:  20 tablet    Refill:  0  . Pseudoephedrine-Guaifenesin [id number] MG TB12    Sig: Take 1 tablet 2 (two) times daily by mouth. For congestion    Dispense:  20 each    Refill:  0    No orders of the defined types were placed in this encounter.   Follow up as needed.  Mechele ClaudeWarren Gwyneth Fernandez, MD

## 2017-01-04 ENCOUNTER — Other Ambulatory Visit: Payer: Self-pay | Admitting: Family Medicine

## 2017-01-04 NOTE — Telephone Encounter (Signed)
Last filled 12/08/16

## 2017-01-05 NOTE — Telephone Encounter (Signed)
rx called into pharmacy

## 2017-02-04 ENCOUNTER — Ambulatory Visit: Payer: Managed Care, Other (non HMO) | Admitting: Family Medicine

## 2017-02-04 ENCOUNTER — Encounter: Payer: Self-pay | Admitting: Family Medicine

## 2017-02-04 VITALS — BP 125/74 | HR 86 | Temp 97.1°F | Ht 64.0 in | Wt 212.0 lb

## 2017-02-04 DIAGNOSIS — F411 Generalized anxiety disorder: Secondary | ICD-10-CM

## 2017-02-04 DIAGNOSIS — R002 Palpitations: Secondary | ICD-10-CM

## 2017-02-04 MED ORDER — PHENTERMINE HCL 37.5 MG PO TABS: 37.5000 mg | ORAL_TABLET | Freq: Every morning | ORAL | 2 refills | Status: DC

## 2017-02-04 NOTE — Progress Notes (Signed)
Subjective:  Patient ID: Rhonda Anthony, female    DOB: November 10, 1970  Age: 47 y.o. MRN: [id number]  CC: Follow-up (pt here today for follow up on weight loss)   HPI [name] presents for recheck up on her weight loss.  She is also been suffering from depression.  The depression is doing quite well although she does get a bit stressed out by her mother-in-law living with him.  When she was here before the plan had been that the mother-in-law would not move in but that the plan changed so now the mother-in-law is living in her home with she and her husband.  This leads to stress because the mother-in-law is chronically depressed and negative.  She had a blow up with her this morning.  This was because she had come to the end of her rope dealing with negativity.  Overall her depression has been doing well please see the PHQ scores below.  She does continue to take the Cymbalta as well as her weight loss medicine been determined.  She reports occasional benign palpitations that are resolved spontaneously.  A couple of times she has had to lay down for a few minutes.  However there is been no chest pain or shortness of breath associated.  She thinks the stress from her mother-in-law probably is a bigger factor than the medication.  She is losing weight at the rate of about a pound a week.  This has been continued through the holidays in spite of a great deal of temptation to be off of her diet. Depression screen [facility] 2/9 02/04/2017 12/17/2016 11/07/2016  Decreased Interest 0 0 0  Down, Depressed, Hopeless 0 0 0  PHQ - 2 Score 0 0 0  Altered sleeping - - -  Tired, decreased energy - - -  Change in appetite - - -  Feeling bad or failure about yourself  - - -  Trouble concentrating - - -  Moving slowly or fidgety/restless - - -  Suicidal thoughts - - -  PHQ-9 Score - - -    History Rhonda Anthony has a past medical history of GERD (gastroesophageal reflux disease), Internal hemorrhoids without  mention of complication (2009), and Stomach ulcer.   She has a past surgical history that includes Appendectomy; Abdominal hysterectomy; Eye surgery; Tubal ligation; Ectopic pregnancy surgery; Knee surgery (Left); Colonoscopy (2014); and Esophagogastroduodenoscopy (2014).   Her family history includes Anuerysm in her mother; Diabetes in her unknown relative.She reports that she has quit smoking. she has never used smokeless tobacco. She reports that she drinks alcohol. She reports that she does not use drugs.    ROS Review of Systems  Constitutional: Negative for activity change, appetite change and fever.  HENT: Negative for congestion, rhinorrhea and sore throat.   Eyes: Negative for visual disturbance.  Respiratory: Negative for cough and shortness of breath.   Cardiovascular: Positive for palpitations. Negative for chest pain.  Gastrointestinal: Negative for abdominal pain, diarrhea and nausea.  Genitourinary: Negative for dysuria.  Musculoskeletal: Negative for arthralgias and myalgias.  Psychiatric/Behavioral: The patient is nervous/anxious.     Objective:  BP 125/74   Pulse 86   Temp (!) 97.1 F (36.2 C) (Oral)   Ht 5\' 4"  (1.626 m)   Wt 212 lb (96.2 kg)   BMI 36.39 kg/m   BP Readings from Last 3 Encounters:  02/04/17 125/74  12/17/16 137/76  11/07/16 117/76    Wt Readings from Last 3 Encounters:  02/04/17 212 lb (  96.2 kg)  12/17/16 219 lb (99.3 kg)  11/07/16 230 lb (104.3 kg)     Physical Exam  Constitutional: She is oriented to person, place, and time. She appears well-developed and well-nourished. No distress.  HENT:  Head: Normocephalic and atraumatic.  Eyes: Conjunctivae are normal. Pupils are equal, round, and reactive to light.  Neck: Normal range of motion. Neck supple. No thyromegaly present.  Cardiovascular: Normal rate, regular rhythm and normal heart sounds.  No murmur heard. Pulmonary/Chest: Effort normal and breath sounds normal. No respiratory  distress. She has no wheezes. She has no rales.  Abdominal: Soft.  Musculoskeletal: Normal range of motion.  Lymphadenopathy:    She has no cervical adenopathy.  Neurological: She is alert and oriented to person, place, and time.  Skin: Skin is warm and dry.  Psychiatric: She has a normal mood and affect. Her behavior is normal. Judgment and thought content normal.      Assessment & Plan:   Foye ClockKristina was seen today for follow-up.  Diagnoses and all orders for this visit:  Palpitation  GAD (generalized anxiety disorder)  Other orders -     phentermine (ADIPEX-P) 37.5 MG tablet; Take 1 tablet (37.5 mg total) by mouth every morning.    Should the palpitations become more common last longer or become associated with other symptoms such as dizziness faintness chest pain or dyspnea she should discontinue the Tenormin and follow-up immediately   I have discontinued Foye ClockKristina T. Quinn PlowmanFrobeen "Tina"'s phentermine, amoxicillin-clavulanate, and Pseudoephedrine-Guaifenesin. I have also changed her phentermine. Additionally, I am having her maintain her DULoxetine.  Allergies as of 02/04/2017      Reactions   Biaxin [clarithromycin]    unknown   Demerol [meperidine]    unknown      Medication List        Accurate as of 02/04/17  5:47 PM. Always use your most recent med list.          DULoxetine 60 MG capsule Commonly known as:  CYMBALTA Take 1 capsule (60 mg total) by mouth daily.   phentermine 37.5 MG tablet Commonly known as:  ADIPEX-P Take 1 tablet (37.5 mg total) by mouth every morning.        Follow-up: Return in about 3 months (around 05/05/2017).  Mechele ClaudeWarren Toa Mia, M.D.

## 2017-02-08 ENCOUNTER — Ambulatory Visit: Payer: Managed Care, Other (non HMO) | Admitting: Family Medicine

## 2017-03-27 ENCOUNTER — Other Ambulatory Visit: Payer: Self-pay | Admitting: Family Medicine

## 2017-04-09 ENCOUNTER — Encounter: Payer: Self-pay | Admitting: Family Medicine

## 2017-05-06 ENCOUNTER — Encounter: Payer: Self-pay | Admitting: Family Medicine

## 2017-05-06 ENCOUNTER — Ambulatory Visit: Payer: Managed Care, Other (non HMO) | Admitting: Family Medicine

## 2017-05-06 VITALS — Temp 97.5°F | Ht 64.0 in | Wt 207.5 lb

## 2017-05-06 DIAGNOSIS — F411 Generalized anxiety disorder: Secondary | ICD-10-CM | POA: Diagnosis not present

## 2017-05-06 DIAGNOSIS — E669 Obesity, unspecified: Secondary | ICD-10-CM | POA: Diagnosis not present

## 2017-05-06 MED ORDER — PHENTERMINE HCL 8 MG PO TABS: ORAL_TABLET | ORAL | 0 refills | Status: DC

## 2017-05-06 NOTE — Progress Notes (Signed)
Subjective:  Patient ID: Rhonda Anthony, female    DOB: Jun 02, 1970  Age: 47 y.o. MRN: [id number]  CC: Follow-up (pt here today to discuss going off phentermine )   HPI Rhonda FlankKristina T Anthony presents for recheck of her depression.  She also has started to become more tolerant of the impairment as evidenced by the fact that it does not seem to diminish her appetite and she has only lost 4-1/2 pounds in 3 months as opposed to about a pound a week when she first started taking the medication.  PHQ score is noted below.  She is still dealing with having a mother-in-law in her home with whom she does not get along.  She feels the mother was very negative and rather demanding as far as having her do things for her even though she is capable of doing them for herself.  Currently she finds herself annoyed just by the sound of her mother-in-law's voice.  She does feel frustrated and sad at times.  She says she had some decreased interest in activities initially when her mother-in-law moved in but that has gone away now.  She continues to take the Cymbalta without any problems.  [facility] 2/9 05/06/2017 02/04/2017 12/17/2016  Decreased Interest 1 0 0  Down, Depressed, Hopeless 1 0 0  PHQ - 2 Score 2 0 0  Altered sleeping 1 - -  Tired, decreased energy 1 - -  Change in appetite 1 - -  Feeling bad or failure about yourself  0 - -  Trouble concentrating 1 - -  Moving slowly or fidgety/restless 0 - -  Suicidal thoughts 0 - -  PHQ-9 Score 6 - -    History Rhonda Anthony has a past medical history of GERD (gastroesophageal reflux disease), Internal hemorrhoids without mention of complication (2009), and Stomach ulcer.   She has a past surgical history that includes Appendectomy; Abdominal hysterectomy; Eye surgery; Tubal ligation; Ectopic pregnancy surgery; Knee surgery (Left); Colonoscopy (2014); and Esophagogastroduodenoscopy (2014).   Her family history includes Anuerysm in her mother; Diabetes  in her unknown relative.She reports that she has quit smoking. She has never used smokeless tobacco. She reports that she drinks alcohol. She reports that she does not use drugs.    ROS Review of Systems  Constitutional: Negative.   HENT: Negative for congestion.   Eyes: Negative for visual disturbance.  Respiratory: Negative for shortness of breath.   Cardiovascular: Negative for chest pain.  Gastrointestinal: Negative for abdominal pain, constipation, diarrhea, nausea and vomiting.  Genitourinary: Negative for difficulty urinating.  Musculoskeletal: Negative for arthralgias and myalgias.  Neurological: Negative for headaches.  Psychiatric/Behavioral: Negative for sleep disturbance.    Objective:  Temp (!) 97.5 F (36.4 C) (Oral)   Ht 5\' 4"  (1.626 m)   Wt 207 lb 8 oz (94.1 kg)   BMI 35.62 kg/m   BP Readings from Last 3 Encounters:  02/04/17 125/74  12/17/16 137/76  11/07/16 117/76    Wt Readings from Last 3 Encounters:  05/06/17 207 lb 8 oz (94.1 kg)  02/04/17 212 lb (96.2 kg)  12/17/16 219 lb (99.3 kg)     Physical Exam  Constitutional: She is oriented to person, place, and time. She appears well-developed and well-nourished. No distress.  HENT:  Head: Normocephalic and atraumatic.  Right Ear: External ear normal.  Left Ear: External ear normal.  Neck: Normal range of motion. Neck supple.  Cardiovascular: Normal rate, regular rhythm and normal heart sounds.  No murmur  heard. Pulmonary/Chest: Effort normal and breath sounds normal. No respiratory distress. She has no wheezes. She has no rales.  Abdominal: Soft. There is no tenderness.  Musculoskeletal: Normal range of motion. She exhibits no edema or tenderness.  Neurological: She is alert and oriented to person, place, and time. She exhibits normal muscle tone. Coordination normal.  Skin: Skin is warm and dry.  Psychiatric: She has a normal mood and affect. Her behavior is normal.      Assessment & Plan:    Haevyn was seen today for follow-up.  Diagnoses and all orders for this visit:  GAD (generalized anxiety disorder)  Obesity (BMI 35.0-39.9 without comorbidity)  Other orders -     Phentermine HCl 8 MG TABS; Take 4 daily for 1 week, then 3 for 1 week then 2 for 1 week then 1 daily for the last week       I have discontinued Rhonda Clock T. Quinn Plowman "Tina"'s phentermine. I am also having her start on Phentermine HCl. Additionally, I am having her maintain her DULoxetine.  Allergies as of 05/06/2017      Reactions   Biaxin [clarithromycin]    unknown   Demerol [meperidine]    unknown   Nsaids       Medication List        Accurate as of 05/06/17  6:31 PM. Always use your most recent med list.          DULoxetine 60 MG capsule Commonly known as:  CYMBALTA TAKE 1 CAPSULE BY MOUTH DAILY   Phentermine HCl 8 MG Tabs Take 4 daily for 1 week, then 3 for 1 week then 2 for 1 week then 1 daily for the last week        Follow-up: Return in about 6 months (around 11/05/2017).  Mechele Claude, M.D.

## 2017-05-06 NOTE — Patient Instructions (Signed)
Your provider wants you to schedule an appointment with a Psychologist/Psychiatrist. The following list of offices requires the patient to call and make their own appointment, as there is information they need that only you can provide. Please feel free to choose form the following providers:  [facility]   [phone] Crisis Recovery [place] [phone]    [facility]    [phone]  [address], [address]    [place], [place]       [facility]  [phone] [address] [place], [place]  Evaluates for Autism but does not treat it Sees Children / Accepts Medicaid  [facility]    [phone] [address], [address]   [place], [place] Medication management, substance abuse, bipolar, grief, family, marriage, OCD, anxiety, PTSD Sees children / Accepts Medicaid  [facility]    [phone] [address] [place], [place] Sees children / [facility]  [facility]  [phone] [address] [place], [place]   Dr Estelle GrumblesAkinlayo     [phone] [address] [place] [address] [place], [place]  Sees ADD & ADHD for treatment Accepts Medicaid  [facility]  [phone] [address] [place], [place] Evaluates for Autism [facility]s [facility]  [facility]  [phone] [address] [place], [place]  Does Adult ADD evaluations Does not accept Medicaid  [facility]   [phone] [address]   [place], [place] Uses animal therapy  Sees children as young as 47 years old [facility]s [facility]  [facility]     [phone]    [address]  [place], [place] [phone] Sees children Accepts Medicaid

## 2017-05-07 ENCOUNTER — Telehealth: Payer: Self-pay

## 2017-05-07 ENCOUNTER — Other Ambulatory Visit: Payer: Self-pay | Admitting: Family Medicine

## 2017-05-07 MED ORDER — PHENTERMINE HCL 15 MG PO TBDP: ORAL_TABLET | ORAL | 0 refills | Status: DC

## 2017-05-07 NOTE — Telephone Encounter (Signed)
I sent a substitute. Directions are a little different. Hopefully it will be covered.

## 2017-05-07 NOTE — Telephone Encounter (Signed)
Insurance denied prior auth for [employer]  Plan does not cover this medication  Excluded from coverage

## 2017-06-27 ENCOUNTER — Other Ambulatory Visit: Payer: Self-pay | Admitting: Family Medicine

## 2017-07-05 ENCOUNTER — Encounter: Payer: Self-pay | Admitting: *Deleted

## 2017-08-30 ENCOUNTER — Telehealth: Payer: Self-pay | Admitting: Family Medicine

## 2017-08-30 NOTE — Telephone Encounter (Signed)
Informed patient that Dr. Darlyn ReadStacks will be out of the office until Monday 8/5 and will review once back in the office

## 2017-09-01 NOTE — Telephone Encounter (Signed)
Pt. Needs to be seen for this. Thanks, WS 

## 2017-09-02 NOTE — Telephone Encounter (Signed)
Pt aware appt made

## 2017-09-05 ENCOUNTER — Ambulatory Visit: Payer: Managed Care, Other (non HMO) | Admitting: Family Medicine

## 2017-09-05 ENCOUNTER — Encounter: Payer: Self-pay | Admitting: Family Medicine

## 2017-09-05 VITALS — BP 115/70 | HR 82 | Temp 97.0°F | Ht 64.0 in | Wt 212.1 lb

## 2017-09-05 DIAGNOSIS — E669 Obesity, unspecified: Secondary | ICD-10-CM | POA: Diagnosis not present

## 2017-09-05 MED ORDER — TOPIRAMATE 25 MG PO TABS: 25.0000 mg | ORAL_TABLET | Freq: Every day | ORAL | 2 refills | Status: DC

## 2017-09-05 MED ORDER — PHENTERMINE HCL 37.5 MG PO CAPS: 37.5000 mg | ORAL_CAPSULE | Freq: Every day | ORAL | 2 refills | Status: DC

## 2017-09-05 NOTE — Progress Notes (Signed)
Subjective:  Patient ID: [name], female    DOB: 06-17-1970  Age: 47 y.o. MRN: [id number]  CC: Medication Refill (pt here today to discuss going back on phentermine)   HPI AVERLEE SWARTZ presents for recheck for use of phentermine.  She has had a great deal of stress related to her mother-in-law leaving her home.  The mother-in-law moved out 3 weeks ago.  She now thinks she can focus on weight loss again.  She is concerned about the weight gain that she had since she went off of the from [place] 4 months ago.  She has been tapered off and had no side effects that she reports.  She has had no palpitations with the exception of 1 isolated episode several months ago while taking the medicine.  She is attempting a low-carb diet with exercise as well. Depression screen [facility] 2/9 09/05/2017 05/06/2017 02/04/2017  Decreased Interest 0 1 0  Down, Depressed, Hopeless 0 1 0  PHQ - 2 Score 0 2 0  Altered sleeping - 1 -  Tired, decreased energy - 1 -  Change in appetite - 1 -  Feeling bad or failure about yourself  - 0 -  Trouble concentrating - 1 -  Moving slowly or fidgety/restless - 0 -  Suicidal thoughts - 0 -  PHQ-9 Score - 6 -    History Cj has a past medical history of GERD (gastroesophageal reflux disease), Internal hemorrhoids without mention of complication (2009), and Stomach ulcer.   She has a past surgical history that includes Appendectomy; Abdominal hysterectomy; Eye surgery; Tubal ligation; Ectopic pregnancy surgery; Knee surgery (Left); Colonoscopy (2014); and Esophagogastroduodenoscopy (2014).   Her family history includes Anuerysm in her mother; Diabetes in her unknown relative.She reports that she has quit smoking. She has never used smokeless tobacco. She reports that she drinks alcohol. She reports that she does not use drugs.    ROS Review of Systems Noncontributory Objective:  BP 115/70   Pulse 82   Temp (!) 97 F (36.1 C) (Oral)   Ht 5\' 4"  (1.626 m)    Wt 212 lb 2 oz (96.2 kg)   BMI 36.41 kg/m   BP Readings from Last 3 Encounters:  09/05/17 115/70  02/04/17 125/74  12/17/16 137/76    Wt Readings from Last 3 Encounters:  09/05/17 212 lb 2 oz (96.2 kg)  05/06/17 207 lb 8 oz (94.1 kg)  02/04/17 212 lb (96.2 kg)     Physical Exam  Constitutional: She is oriented to person, place, and time. She appears well-developed and well-nourished. No distress.  Cardiovascular: Normal rate and regular rhythm.  Pulmonary/Chest: Breath sounds normal.  Neurological: She is alert and oriented to person, place, and time.  Skin: Skin is warm and dry.  Psychiatric: She has a normal mood and affect.      Assessment & Plan:   There are no diagnoses linked to this encounter.     I have discontinued Aliyyah T. Quinn Plowman "Tina"'s Phentermine HCl. I am also having her start on phentermine and topiramate. Additionally, I am having her maintain her DULoxetine.  Allergies as of 09/05/2017      Reactions   Biaxin [clarithromycin]    unknown   Demerol [meperidine]    unknown   Nsaids       Medication List        Accurate as of 09/05/17 12:28 PM. Always use your most recent med list.  DULoxetine 60 MG capsule Commonly known as:  CYMBALTA TAKE 1 CAPSULE BY MOUTH DAILY   phentermine 37.5 MG capsule Take 1 capsule (37.5 mg total) by mouth daily before breakfast.   topiramate 25 MG tablet Commonly known as:  TOPAMAX Take 1 tablet (25 mg total) by mouth at bedtime.        Follow-up: No follow-ups on file.  Mechele ClaudeWarren Gurtej Noyola, M.D.

## 2017-09-05 NOTE — Patient Instructions (Signed)

## 2017-09-09 ENCOUNTER — Other Ambulatory Visit: Payer: Self-pay | Admitting: Family Medicine

## 2017-10-28 ENCOUNTER — Telehealth: Payer: Self-pay

## 2017-10-28 NOTE — Telephone Encounter (Signed)
Order faxed.

## 2017-10-28 NOTE — Telephone Encounter (Signed)
Needs an order faxed to [facility]

## 2017-10-29 ENCOUNTER — Encounter: Payer: Self-pay | Admitting: Family Medicine

## 2017-11-01 ENCOUNTER — Telehealth: Payer: Self-pay | Admitting: Family Medicine

## 2017-11-01 NOTE — Telephone Encounter (Signed)
Patient wants to discontinue DULoxetine (CYMBALTA) 60 MG capsule. Wants advise on how? Please advise.

## 2017-11-01 NOTE — Telephone Encounter (Signed)
Spoke with pt - aware stacks is off until [facility] 11/04/17. She is aware we will let stacks decide if appropriate and how to taper off.

## 2017-11-04 ENCOUNTER — Other Ambulatory Visit: Payer: Self-pay | Admitting: Family Medicine

## 2017-11-04 MED ORDER — DULOXETINE HCL 30 MG PO CPEP: ORAL_CAPSULE | ORAL | 0 refills | Status: DC

## 2017-11-04 NOTE — Telephone Encounter (Signed)
Patient aware of recommendations and instructions

## 2017-11-04 NOTE — Telephone Encounter (Signed)
Please contact the patient- I will send in 30 mg capsule one daily  for I week, then 1 every other day for one week. Then DC.

## 2017-11-05 ENCOUNTER — Ambulatory Visit: Payer: Managed Care, Other (non HMO) | Admitting: Family Medicine

## 2017-11-06 ENCOUNTER — Ambulatory Visit: Payer: Managed Care, Other (non HMO) | Admitting: Family Medicine

## 2017-11-18 ENCOUNTER — Telehealth: Payer: Self-pay | Admitting: Family Medicine

## 2017-11-18 MED ORDER — DULOXETINE HCL 30 MG PO CPEP: ORAL_CAPSULE | ORAL | 2 refills | Status: DC

## 2017-11-18 NOTE — Telephone Encounter (Signed)
Spoke with patient regarding side effects from tapering off of cymbalta.  Patient states that she is anxious on the days when she does not take medications from afternoon to evening times and is also having "brain zaps".  Patient would like to know what she needs to do from here

## 2017-11-18 NOTE — Telephone Encounter (Signed)
If that is the case, she needs to continue the current dose, once a day, every day.

## 2017-11-18 NOTE — Addendum Note (Signed)
Addended by: Caryl Bis on: 11/18/2017 11:42 AM   Modules accepted: Orders

## 2017-11-18 NOTE — Telephone Encounter (Signed)
Patient aware of recommendations.  Patient states she will need a refill sent to pharmacy.

## 2017-11-18 NOTE — Telephone Encounter (Signed)
Pt states that she needs to talk to dr stacks nurse about getting off the      DULoxetine (CYMBALTA) 30 MG capsule

## 2017-12-06 ENCOUNTER — Ambulatory Visit: Payer: Managed Care, Other (non HMO) | Admitting: Family Medicine

## 2017-12-06 ENCOUNTER — Encounter: Payer: Self-pay | Admitting: Family Medicine

## 2017-12-06 VITALS — BP 126/65 | HR 68 | Temp 97.1°F | Ht 64.0 in | Wt 217.0 lb

## 2017-12-06 DIAGNOSIS — Z23 Encounter for immunization: Secondary | ICD-10-CM | POA: Diagnosis not present

## 2017-12-06 DIAGNOSIS — Z6837 Body mass index (BMI) 37.0-37.9, adult: Secondary | ICD-10-CM | POA: Diagnosis not present

## 2017-12-06 DIAGNOSIS — R002 Palpitations: Secondary | ICD-10-CM

## 2017-12-06 DIAGNOSIS — F411 Generalized anxiety disorder: Secondary | ICD-10-CM | POA: Diagnosis not present

## 2017-12-06 MED ORDER — DULOXETINE HCL 30 MG PO CPEP: 30.0000 mg | ORAL_CAPSULE | Freq: Every day | ORAL | 1 refills | Status: DC

## 2017-12-06 MED ORDER — DOXYCYCLINE HYCLATE 100 MG PO CAPS: 100.0000 mg | ORAL_CAPSULE | Freq: Two times a day (BID) | ORAL | 0 refills | Status: DC

## 2017-12-06 MED ORDER — TRIAMCINOLONE ACETONIDE 0.1 % EX CREA: 1.0000 "application " | TOPICAL_CREAM | Freq: Three times a day (TID) | CUTANEOUS | 0 refills | Status: DC

## 2017-12-06 NOTE — Progress Notes (Addendum)
Subjective:  Patient ID: Rhonda Anthony, female    DOB: 19-Oct-1970  Age: 47 y.o. MRN: [id number]  CC: Follow-up   HPI Rhonda Anthony presents for attempt to wean off of Cymbalta since she got fairly cranky irritable and anxious when she tried to go from 30 mg a day to 30 mg of every other day.  She thinks she needs to go back on that 30 mg daily.  It seems to help her stay less anxious.  Patient had palpitations when she went on the topiramate and phentermine combination.  She quit both of them after about a week.  No more palpitations since that time.  Denies any chest pain or shortness of breath.  No edema.  Depression screen [facility] 2/9 12/06/2017 09/05/2017 05/06/2017  Decreased Interest 0 0 1  Down, Depressed, Hopeless 0 0 1  PHQ - 2 Score 0 0 2  Altered sleeping - - 1  Tired, decreased energy - - 1  Change in appetite - - 1  Feeling bad or failure about yourself  - - 0  Trouble concentrating - - 1  Moving slowly or fidgety/restless - - 0  Suicidal thoughts - - 0  PHQ-9 Score - - 6    History Rhonda Anthony has a past medical history of GERD (gastroesophageal reflux disease), Internal hemorrhoids without mention of complication (2009), and Stomach ulcer.   She has a past surgical history that includes Appendectomy; Abdominal hysterectomy; Eye surgery; Tubal ligation; Ectopic pregnancy surgery; Knee surgery (Left); Colonoscopy (2014); and Esophagogastroduodenoscopy (2014).   Her family history includes Anuerysm in her mother; Diabetes in her unknown relative.She reports that she has quit smoking. She has never used smokeless tobacco. She reports that she drinks alcohol. She reports that she does not use drugs.    ROS Review of Systems  Constitutional: Negative.   HENT: Negative for congestion.   Eyes: Negative for visual disturbance.  Respiratory: Negative for shortness of breath.   Cardiovascular: Negative for chest pain.  Gastrointestinal: Negative for abdominal pain.    Genitourinary: Negative.   Musculoskeletal: Negative for arthralgias and myalgias.  Neurological: Negative for headaches.    Objective:  BP 126/65   Pulse 68   Temp (!) 97.1 F (36.2 C)   Ht 5\' 4"  (1.626 m)   Wt 217 lb (98.4 kg)   BMI 37.25 kg/m   BP Readings from Last 3 Encounters:  12/06/17 126/65  09/05/17 115/70  02/04/17 125/74    Wt Readings from Last 3 Encounters:  12/06/17 217 lb (98.4 kg)  09/05/17 212 lb 2 oz (96.2 kg)  05/06/17 207 lb 8 oz (94.1 kg)     Physical Exam  Constitutional: She is oriented to person, place, and time. She appears well-developed and well-nourished. No distress.  Cardiovascular: Normal rate and regular rhythm.  Pulmonary/Chest: Breath sounds normal.  Neurological: She is alert and oriented to person, place, and time.  Skin: Skin is warm and dry.  Psychiatric: She has a normal mood and affect.      Assessment & Plan:   Ellarose was seen today for follow-up.  Diagnoses and all orders for this visit:  BMI 37.0-37.9, adult  GAD (generalized anxiety disorder)  Palpitations  Other orders -     DULoxetine (CYMBALTA) 30 MG capsule; Take 1 capsule (30 mg total) by mouth daily. -     doxycycline (VIBRAMYCIN) 100 MG capsule; Take 1 capsule (100 mg total) by mouth 2 (two) times daily. -  triamcinolone cream (KENALOG) 0.1 %; Apply 1 application topically 3 (three) times daily. Aply behind ear       I have discontinued Montgomery T. Quinn Plowman "Tina"'s phentermine and topiramate. I have also changed her DULoxetine. Additionally, I am having her start on doxycycline and triamcinolone cream.  Allergies as of 12/06/2017      Reactions   Biaxin [clarithromycin]    unknown   Demerol [meperidine]    unknown   Nsaids       Medication List        Accurate as of 12/06/17 10:14 AM. Always use your most recent med list.          doxycycline 100 MG capsule Commonly known as:  VIBRAMYCIN Take 1 capsule (100 mg total) by mouth 2  (two) times daily.   DULoxetine 30 MG capsule Commonly known as:  CYMBALTA Take 1 capsule (30 mg total) by mouth daily.   triamcinolone cream 0.1 % Commonly known as:  KENALOG Apply 1 application topically 3 (three) times daily. Aply behind ear      Consider medical weight loss clinic in [place].  This is generally not compatible with the  patient's work schedule.  Consider keto or similar low-carb diet. Follow-up: Return in about 6 months (around 06/06/2018).  Mechele Claude, M.D.

## 2017-12-06 NOTE — Patient Instructions (Signed)
Please follow up with Dr. Stacks in 6 months.  

## 2017-12-06 NOTE — Addendum Note (Signed)
Addended by: Mechele Claude on: 12/06/2017 10:14 AM   Modules accepted: Orders

## 2018-01-02 ENCOUNTER — Other Ambulatory Visit: Payer: Self-pay | Admitting: *Deleted

## 2018-01-18 ENCOUNTER — Encounter: Payer: Self-pay | Admitting: Gastroenterology

## 2018-06-06 ENCOUNTER — Encounter: Payer: Self-pay | Admitting: Family Medicine

## 2018-06-06 ENCOUNTER — Ambulatory Visit (INDEPENDENT_AMBULATORY_CARE_PROVIDER_SITE_OTHER): Payer: Managed Care, Other (non HMO) | Admitting: Family Medicine

## 2018-06-06 ENCOUNTER — Other Ambulatory Visit: Payer: Self-pay

## 2018-06-06 DIAGNOSIS — K219 Gastro-esophageal reflux disease without esophagitis: Secondary | ICD-10-CM

## 2018-06-06 DIAGNOSIS — F411 Generalized anxiety disorder: Secondary | ICD-10-CM | POA: Diagnosis not present

## 2018-06-06 DIAGNOSIS — E669 Obesity, unspecified: Secondary | ICD-10-CM

## 2018-06-06 MED ORDER — DULOXETINE HCL 20 MG PO CPEP: 20.0000 mg | ORAL_CAPSULE | Freq: Every day | ORAL | 0 refills | Status: DC

## 2018-06-06 NOTE — Progress Notes (Signed)
Subjective:    Patient ID: Rhonda Anthony, female    DOB: 05-Jan-1971, 48 y.o.   MRN: [id number]   HPI: Rhonda Anthony is a 47 y.o. female presenting for recheck of anxiety.   Denies palpitations since DC of phentermine. Wants to continue to try on her own to lose weight.   Denies, sadness, depression, hoplessness and worry (other than natural concern for children.)  Patient in for follow-up of GERD. Currently asymptomatic taking  PPI daily. There is no chest pain or heartburn. No hematemesis and no melena. No dysphagia or choking. Onset is remote. Progression is stable. Complicating factors, none.    Depression screen [facility] 2/9 12/06/2017 09/05/2017 05/06/2017 02/04/2017 12/17/2016  Decreased Interest 0 0 1 0 0  Down, Depressed, Hopeless 0 0 1 0 0  PHQ - 2 Score 0 0 2 0 0  Altered sleeping - - 1 - -  Tired, decreased energy - - 1 - -  Change in appetite - - 1 - -  Feeling bad or failure about yourself  - - 0 - -  Trouble concentrating - - 1 - -  Moving slowly or fidgety/restless - - 0 - -  Suicidal thoughts - - 0 - -  PHQ-9 Score - - 6 - -     Relevant past medical, surgical, family and social history reviewed and updated as indicated.  Interim medical history since our last visit reviewed. Allergies and medications reviewed and updated.  ROS:  Review of Systems  Constitutional: Negative.   HENT: Negative for congestion.   Eyes: Negative for visual disturbance.  Respiratory: Negative for shortness of breath.   Cardiovascular: Negative for chest pain.  Gastrointestinal: Negative for abdominal pain, constipation, diarrhea, nausea and vomiting.  Genitourinary: Negative for difficulty urinating.  Musculoskeletal: Negative for arthralgias and myalgias.  Neurological: Negative for headaches.  Psychiatric/Behavioral: Negative for sleep disturbance.     Social History   Tobacco Use  Smoking Status Former Smoker  Smokeless Tobacco Never Used       Objective:      Wt Readings from Last 3 Encounters:  12/06/17 217 lb (98.4 kg)  09/05/17 212 lb 2 oz (96.2 kg)  05/06/17 207 lb 8 oz (94.1 kg)     Exam deferred. Pt. Harboring due to COVID 19. Phone visit performed.   Assessment & Plan:   1. GAD (generalized anxiety disorder)   2. Obesity (BMI 35.0-39.9 without comorbidity)   3. Gastroesophageal reflux disease, esophagitis presence not specified     Meds ordered this encounter  Medications  . DULoxetine (CYMBALTA) 20 MG capsule    Sig: Take 1 capsule (20 mg total) by mouth daily.    Dispense:  30 capsule    Refill:  0    No orders of the defined types were placed in this encounter.     Diagnoses and all orders for this visit:  GAD (generalized anxiety disorder)  Obesity (BMI 35.0-39.9 without comorbidity)  Gastroesophageal reflux disease, esophagitis presence not specified  Other orders -     DULoxetine (CYMBALTA) 20 MG capsule; Take 1 capsule (20 mg total) by mouth daily.    Virtual Visit via telephone Note  I discussed the limitations, risks, security and privacy concerns of performing an evaluation and management service by telephone and the availability of in person appointments. The patient was identified with two identifiers. Pt.expressed understanding and agreed to proceed. Pt. Is at home. Dr. Darlyn Read is in his office.  Follow Up Instructions:  I discussed the assessment and treatment plan with the patient. The patient was provided an opportunity to ask questions and all were answered. The patient agreed with the plan and demonstrated an understanding of the instructions.   The patient was advised to call back or seek an in-person evaluation if the symptoms worsen or if the condition fails to improve as anticipated.   Total minutes including chart review and phone contact time: 15   Follow up plan: Return in about 6 months (around 12/07/2018) for Wellness.  Mechele ClaudeWarren Savannah Erbe, MD [facility]

## 2018-08-14 ENCOUNTER — Other Ambulatory Visit: Payer: Self-pay | Admitting: Family

## 2018-08-14 DIAGNOSIS — R928 Other abnormal and inconclusive findings on diagnostic imaging of breast: Secondary | ICD-10-CM

## 2018-08-21 ENCOUNTER — Other Ambulatory Visit: Payer: Self-pay | Admitting: Family

## 2018-08-21 DIAGNOSIS — R928 Other abnormal and inconclusive findings on diagnostic imaging of breast: Secondary | ICD-10-CM

## 2018-09-05 ENCOUNTER — Other Ambulatory Visit: Payer: Self-pay | Admitting: Family Medicine

## 2018-09-05 DIAGNOSIS — N6002 Solitary cyst of left breast: Secondary | ICD-10-CM

## 2018-09-08 ENCOUNTER — Other Ambulatory Visit: Payer: Self-pay | Admitting: Family

## 2018-09-08 DIAGNOSIS — R928 Other abnormal and inconclusive findings on diagnostic imaging of breast: Secondary | ICD-10-CM

## 2018-09-23 ENCOUNTER — Encounter (HOSPITAL_COMMUNITY): Payer: Managed Care, Other (non HMO)

## 2019-02-12 ENCOUNTER — Other Ambulatory Visit: Payer: Self-pay | Admitting: Geriatric Medicine

## 2019-02-12 ENCOUNTER — Other Ambulatory Visit (HOSPITAL_COMMUNITY): Payer: Self-pay | Admitting: Geriatric Medicine

## 2019-02-12 DIAGNOSIS — R1011 Right upper quadrant pain: Secondary | ICD-10-CM

## 2019-02-13 ENCOUNTER — Ambulatory Visit (HOSPITAL_COMMUNITY): Payer: Managed Care, Other (non HMO)

## 2019-02-16 ENCOUNTER — Other Ambulatory Visit: Payer: Self-pay

## 2019-02-16 ENCOUNTER — Ambulatory Visit (HOSPITAL_COMMUNITY)
Admission: RE | Admit: 2019-02-16 | Discharge: 2019-02-16 | Disposition: A | Discharge status: Home / Self Care | Payer: Managed Care, Other (non HMO) | Source: Ambulatory Visit | Attending: Geriatric Medicine | Admitting: Geriatric Medicine

## 2019-02-16 DIAGNOSIS — R1011 Right upper quadrant pain: Secondary | ICD-10-CM | POA: Diagnosis present

## 2019-02-16 MED ORDER — TECHNETIUM TC 99M MEBROFENIN IV KIT
5.2500 | PACK | Freq: Once | INTRAVENOUS | Status: AC | PRN
  Administered 2019-02-16: 5.25 via INTRAVENOUS

## 2019-06-03 ENCOUNTER — Other Ambulatory Visit: Payer: Self-pay | Admitting: Chiropractic Medicine

## 2019-06-03 DIAGNOSIS — E02 Subclinical iodine-deficiency hypothyroidism: Secondary | ICD-10-CM

## 2019-06-11 ENCOUNTER — Ambulatory Visit
Admission: RE | Admit: 2019-06-11 | Discharge: 2019-06-11 | Disposition: A | Discharge status: Home / Self Care | Payer: Self-pay | Source: Ambulatory Visit | Attending: Chiropractic Medicine | Admitting: Chiropractic Medicine

## 2019-06-11 ENCOUNTER — Ambulatory Visit
Admission: RE | Admit: 2019-06-11 | Discharge: 2019-06-11 | Disposition: A | Discharge status: Home / Self Care | Payer: Managed Care, Other (non HMO) | Source: Ambulatory Visit | Attending: Chiropractic Medicine | Admitting: Chiropractic Medicine

## 2019-06-11 ENCOUNTER — Other Ambulatory Visit: Payer: Self-pay | Admitting: Chiropractic Medicine

## 2019-06-11 DIAGNOSIS — E02 Subclinical iodine-deficiency hypothyroidism: Secondary | ICD-10-CM

## 2020-06-08 ENCOUNTER — Other Ambulatory Visit: Payer: Self-pay

## 2020-06-08 ENCOUNTER — Ambulatory Visit: Payer: Managed Care, Other (non HMO) | Admitting: Family Medicine

## 2020-06-08 ENCOUNTER — Encounter: Payer: Self-pay | Admitting: Family Medicine

## 2020-06-08 VITALS — BP 122/71 | HR 66 | Temp 98.1°F | Ht 64.0 in | Wt 228.2 lb

## 2020-06-08 DIAGNOSIS — E538 Deficiency of other specified B group vitamins: Secondary | ICD-10-CM

## 2020-06-08 DIAGNOSIS — K219 Gastro-esophageal reflux disease without esophagitis: Secondary | ICD-10-CM | POA: Diagnosis not present

## 2020-06-08 DIAGNOSIS — E559 Vitamin D deficiency, unspecified: Secondary | ICD-10-CM | POA: Diagnosis not present

## 2020-06-08 DIAGNOSIS — F411 Generalized anxiety disorder: Secondary | ICD-10-CM

## 2020-06-08 DIAGNOSIS — Z1231 Encounter for screening mammogram for malignant neoplasm of breast: Secondary | ICD-10-CM | POA: Diagnosis not present

## 2020-06-08 DIAGNOSIS — B1910 Unspecified viral hepatitis B without hepatic coma: Secondary | ICD-10-CM

## 2020-06-08 DIAGNOSIS — B159 Hepatitis A without hepatic coma: Secondary | ICD-10-CM

## 2020-06-08 MED ORDER — PANTOPRAZOLE SODIUM 40 MG PO TBEC: 40.0000 mg | DELAYED_RELEASE_TABLET | Freq: Every day | ORAL | 11 refills | Status: DC

## 2020-06-08 MED ORDER — SAXENDA 18 MG/3ML ~~LOC~~ SOPN: PEN_INJECTOR | SUBCUTANEOUS | 5 refills | Status: DC

## 2020-06-08 NOTE — Progress Notes (Signed)
Subjective:  Patient ID: Rhonda Anthony, female    DOB: 1970/09/28  Age: 50 y.o. MRN: [id number]  CC: Check Up   HPI Rhonda Anthony presents for frequent watery BMs for 2 mos. Recent color change was orangish, now darker. Staying away from red meat. Occasional pork. Not eating out. Having fecal urgency. Usually 4 times a day. Maybe more if she eats larger amounts. Peanut butter, and eggs makes it worse. Sometimes worse with chocolate. Will have to go repeatedly for several hours after these.  Abd pain frequently at epigastrum. Had endoscopy showing a Hiatal hernia.  Was diagnoses several mos ago with Hep A&B.  No treatment in particular was offered.  Her symptoms are those as noted above.  Anxiety was situational. That situation has resolved.  Anxiety no longer present.   Patient is highly interested in weight loss program.  She has been saving money to consider various programs such as bariatric, liposuction etc.  She would like to start on Saxenda or similar agent right away.  She has put on a few pounds since her last use of phentermine 2 years ago.  Depression screen [facility] 2/9 06/08/2020 12/06/2017 09/05/2017  Decreased Interest 0 0 0  Down, Depressed, Hopeless 0 0 0  PHQ - 2 Score 0 0 0  Altered sleeping - - -  Tired, decreased energy - - -  Change in appetite - - -  Feeling bad or failure about yourself  - - -  Trouble concentrating - - -  Moving slowly or fidgety/restless - - -  Suicidal thoughts - - -  PHQ-9 Score - - -    History Rhonda Anthony has a past medical history of Abdominal pain, left lower quadrant (07/01/2007), Chest pain (06/26/2012), GERD (gastroesophageal reflux disease), Internal hemorrhoids without mention of complication (6116), Stomach ulcer, and Stress at work (06/26/2012).   She has a past surgical history that includes Appendectomy; Abdominal hysterectomy; Eye surgery; Tubal ligation; Ectopic pregnancy surgery; Knee surgery (Left); Colonoscopy (2014); and  Esophagogastroduodenoscopy (2014).   Her family history includes Anuerysm in her mother; Diabetes in her unknown relative.She reports that she has quit smoking. She has never used smokeless tobacco. She reports current alcohol use. She reports that she does not use drugs.    ROS Review of Systems  Constitutional: Positive for activity change, appetite change and fatigue. Negative for fever.  HENT: Negative for congestion.   Eyes: Negative for visual disturbance.  Respiratory: Negative for shortness of breath.   Cardiovascular: Negative for chest pain.  Gastrointestinal: Positive for abdominal pain, diarrhea and nausea. Negative for constipation and vomiting.  Genitourinary: Negative for difficulty urinating.  Musculoskeletal: Negative for arthralgias and myalgias.  Neurological: Negative for headaches.  Psychiatric/Behavioral: Negative for sleep disturbance.    Objective:  BP 122/71   Pulse 66   Temp 98.1 F (36.7 C)   Ht '5\' 4"'  (1.626 m)   Wt 228 lb 3.2 oz (103.5 kg)   SpO2 98%   BMI 39.17 kg/m   BP Readings from Last 3 Encounters:  06/08/20 122/71  12/06/17 126/65  09/05/17 115/70    Wt Readings from Last 3 Encounters:  06/08/20 228 lb 3.2 oz (103.5 kg)  12/06/17 217 lb (98.4 kg)  09/05/17 212 lb 2 oz (96.2 kg)     Physical Exam Constitutional:      General: She is not in acute distress.    Appearance: She is well-developed.  HENT:     Head: Normocephalic and atraumatic.  Eyes:     Conjunctiva/sclera: Conjunctivae normal.     Pupils: Pupils are equal, round, and reactive to light.  Neck:     Thyroid: No thyromegaly.  Cardiovascular:     Rate and Rhythm: Normal rate and regular rhythm.     Heart sounds: Normal heart sounds. No murmur heard.   Pulmonary:     Effort: Pulmonary effort is normal. No respiratory distress.     Breath sounds: Normal breath sounds. No wheezing or rales.  Abdominal:     General: Bowel sounds are normal. There is distension.      Palpations: Abdomen is soft. There is no mass.     Tenderness: There is abdominal tenderness (epigastric). There is no right CVA tenderness, left CVA tenderness, guarding or rebound.     Hernia: No hernia is present.  Musculoskeletal:        General: Normal range of motion.     Cervical back: Normal range of motion and neck supple.  Lymphadenopathy:     Cervical: No cervical adenopathy.  Skin:    General: Skin is warm and dry.  Neurological:     Mental Status: She is alert and oriented to person, place, and time.  Psychiatric:        Behavior: Behavior normal.        Thought Content: Thought content normal.        Judgment: Judgment normal.       Assessment & Plan:   Rhonda Anthony was seen today for check up.  Diagnoses and all orders for this visit:  Gastroesophageal reflux disease, unspecified whether esophagitis present -     Hepatitis C antibody -     STI Profile, CT/NG/TV -     CBC with Differential/Platelet -     CMP14+EGFR -     Lipid panel  Encounter for screening mammogram for malignant neoplasm of breast -     MM Digital Screening; Future -     Hepatitis C antibody -     STI Profile, CT/NG/TV  GAD (generalized anxiety disorder) -     Hepatitis C antibody -     STI Profile, CT/NG/TV  Vitamin D deficiency -     VITAMIN D 25 Hydroxy (Vit-D Deficiency, Fractures)  B12 deficiency -     Vitamin B12  Viral hepatitis A without hepatic coma -     CBC with Differential/Platelet -     CMP14+EGFR -     Lipid panel  Hepatitis B infection without delta agent without hepatic coma, unspecified chronicity -     CBC with Differential/Platelet -     CMP14+EGFR -     Lipid panel  Other orders -     Liraglutide -Weight Management (SAXENDA) 18 MG/3ML SOPN; Use 0.6 daily for one week. Then go to 1.2 on dial daily for one week. Then go to 1.8 daily -     pantoprazole (PROTONIX) 40 MG tablet; Take 1 tablet (40 mg total) by mouth daily. For stomach       I have  discontinued Rhonda Anthony "Tina"'s DULoxetine. I am also having her start on Saxenda and pantoprazole.  Allergies as of 06/08/2020      Reactions   Biaxin [clarithromycin]    unknown   Demerol [meperidine]    unknown   Nsaids    Phentermine Palpitations      Medication List       Accurate as of Jun 08, 2020 12:12 PM. If you have any questions, ask  your nurse or doctor.        STOP taking these medications   DULoxetine 20 MG capsule Commonly known as: CYMBALTA Stopped by: Claretta Fraise, MD     TAKE these medications   pantoprazole 40 MG tablet Commonly known as: PROTONIX Take 1 tablet (40 mg total) by mouth daily. For stomach Started by: Claretta Fraise, MD   Saxenda 18 MG/3ML Sopn Generic drug: Liraglutide -Weight Management Use 0.6 daily for one week. Then go to 1.2 on dial daily for one week. Then go to 1.8 daily Started by: Claretta Fraise, MD        Follow-up: Return in about 6 weeks (around 07/20/2020).  Claretta Fraise, M.D.

## 2020-06-09 LAB — CBC WITH DIFFERENTIAL/PLATELET
Basophils Absolute: 0 10*3/uL (ref 0.0–0.2)
Basos: 0 %
EOS (ABSOLUTE): 0.1 10*3/uL (ref 0.0–0.4)
Eos: 2 %
Hematocrit: 39.6 % (ref 34.0–46.6)
Hemoglobin: 13.4 g/dL (ref 11.1–15.9)
Immature Grans (Abs): 0 10*3/uL (ref 0.0–0.1)
Immature Granulocytes: 0 %
Lymphocytes Absolute: 1.7 10*3/uL (ref 0.7–3.1)
Lymphs: 31 %
MCH: 29.6 pg (ref 26.6–33.0)
MCHC: 33.8 g/dL (ref 31.5–35.7)
MCV: 88 fL (ref 79–97)
Monocytes Absolute: 0.4 10*3/uL (ref 0.1–0.9)
Monocytes: 8 %
Neutrophils Absolute: 3.2 10*3/uL (ref 1.4–7.0)
Neutrophils: 59 %
Platelets: 307 10*3/uL (ref 150–450)
RBC: 4.52 x10E6/uL (ref 3.77–5.28)
RDW: 12.9 % (ref 11.7–15.4)
WBC: 5.5 10*3/uL (ref 3.4–10.8)

## 2020-06-09 LAB — CMP14+EGFR
ALT: 16 IU/L (ref 0–32)
AST: 18 IU/L (ref 0–40)
Albumin/Globulin Ratio: 1.8 (ref 1.2–2.2)
Albumin: 4.5 g/dL (ref 3.8–4.8)
Alkaline Phosphatase: 84 IU/L (ref 44–121)
BUN/Creatinine Ratio: 11 (ref 9–23)
BUN: 11 mg/dL (ref 6–24)
Bilirubin Total: 0.4 mg/dL (ref 0.0–1.2)
CO2: 23 mmol/L (ref 20–29)
Calcium: 9.3 mg/dL (ref 8.7–10.2)
Chloride: 101 mmol/L (ref 96–106)
Creatinine, Ser: 0.99 mg/dL (ref 0.57–1.00)
Globulin, Total: 2.5 g/dL (ref 1.5–4.5)
Glucose: 86 mg/dL (ref 65–99)
Potassium: 4.6 mmol/L (ref 3.5–5.2)
Sodium: 140 mmol/L (ref 134–144)
Total Protein: 7 g/dL (ref 6.0–8.5)
eGFR: 69 mL/min/{1.73_m2} (ref >59)

## 2020-06-09 LAB — VITAMIN D 25 HYDROXY (VIT D DEFICIENCY, FRACTURES): Vit D, 25-Hydroxy: 37.8 ng/mL (ref 30.0–100.0)

## 2020-06-09 LAB — LIPID PANEL
Chol/HDL Ratio: 3 ratio (ref 0.0–4.4)
Cholesterol, Total: 161 mg/dL (ref 100–199)
HDL: 54 mg/dL (ref >39)
LDL Chol Calc (NIH): 77 mg/dL (ref 0–99)
Triglycerides: 178 mg/dL — ABNORMAL HIGH (ref 0–149)
VLDL Cholesterol Cal: 30 mg/dL (ref 5–40)

## 2020-06-09 LAB — VITAMIN B12: Vitamin B-12: 621 pg/mL (ref 232–1245)

## 2020-06-09 LAB — HEPATITIS C ANTIBODY: Hep C Virus Ab: 0.1 s/co ratio (ref 0.0–0.9)

## 2020-06-13 LAB — STI PROFILE, CT/NG/TV
Chlamydia by NAA: NEGATIVE
Gonococcus by NAA: NEGATIVE
HCV Ab: <0.1 s/co ratio (ref 0.0–0.9)
HIV Screen 4th Generation wRfx: NONREACTIVE
Hep B Core Total Ab: NEGATIVE
Hep B Surface Ab, Qual: NONREACTIVE
Hepatitis B Surface Ag: NEGATIVE
RPR Ser Ql: NONREACTIVE
Trich vag by NAA: NEGATIVE

## 2020-06-13 LAB — HCV INTERPRETATION

## 2020-07-01 ENCOUNTER — Telehealth: Payer: Self-pay | Admitting: Family Medicine

## 2020-07-01 ENCOUNTER — Ambulatory Visit: Payer: Managed Care, Other (non HMO) | Admitting: Family Medicine

## 2020-07-01 ENCOUNTER — Encounter: Payer: Self-pay | Admitting: Family Medicine

## 2020-07-01 ENCOUNTER — Other Ambulatory Visit: Payer: Self-pay

## 2020-07-01 VITALS — BP 128/71 | HR 78 | Temp 97.8°F | Ht 64.0 in | Wt 223.0 lb

## 2020-07-01 DIAGNOSIS — R399 Unspecified symptoms and signs involving the genitourinary system: Secondary | ICD-10-CM | POA: Diagnosis not present

## 2020-07-01 LAB — MICROSCOPIC EXAMINATION: WBC, UA: NONE SEEN /hpf (ref 0–5)

## 2020-07-01 LAB — URINALYSIS, ROUTINE W REFLEX MICROSCOPIC
Bilirubin, UA: NEGATIVE
Glucose, UA: NEGATIVE
Leukocytes,UA: NEGATIVE
Nitrite, UA: NEGATIVE
Protein,UA: NEGATIVE
RBC, UA: NEGATIVE
Specific Gravity, UA: 1.025 (ref 1.005–1.030)
Urobilinogen, Ur: 0.2 mg/dL (ref 0.2–1.0)
pH, UA: 5 (ref 5.0–7.5)

## 2020-07-01 MED ORDER — CEPHALEXIN 500 MG PO CAPS: 500.0000 mg | ORAL_CAPSULE | Freq: Two times a day (BID) | ORAL | 0 refills | Status: AC

## 2020-07-01 NOTE — Patient Instructions (Signed)
Urinary Tract Infection, Adult  A urinary tract infection (UTI) is an infection of any part of the urinary tract. The urinary tract includes the kidneys, ureters, bladder, and urethra. These organs make, store, and get rid of urine in the body. An upper UTI affects the ureters and kidneys. A lower UTI affects the bladder and urethra. What are the causes? Most urinary tract infections are caused by bacteria in your genital area around your urethra, where urine leaves your body. These bacteria grow and cause inflammation of your urinary tract. What increases the risk? You are more likely to develop this condition if:  You have a urinary catheter that stays in place.  You are not able to control when you urinate or have a bowel movement (incontinence).  You are female and you: ? Use a spermicide or diaphragm for birth control. ? Have low estrogen levels. ? Are pregnant.  You have certain genes that increase your risk.  You are sexually active.  You take antibiotic medicines.  You have a condition that causes your flow of urine to slow down, such as: ? An enlarged prostate, if you are female. ? Blockage in your urethra. ? A kidney stone. ? A nerve condition that affects your bladder control (neurogenic bladder). ? Not getting enough to drink, or not urinating often.  You have certain medical conditions, such as: ? Diabetes. ? A weak disease-fighting system (immunesystem). ? Sickle cell disease. ? Gout. ? Spinal cord injury. What are the signs or symptoms? Symptoms of this condition include:  Needing to urinate right away (urgency).  Frequent urination. This may include small amounts of urine each time you urinate.  Pain or burning with urination.  Blood in the urine.  Urine that smells bad or unusual.  Trouble urinating.  Cloudy urine.  Vaginal discharge, if you are female.  Pain in the abdomen or the lower back. You may also have:  Vomiting or a decreased  appetite.  Confusion.  Irritability or tiredness.  A fever or chills.  Diarrhea. The first symptom in older adults may be confusion. In some cases, they may not have any symptoms until the infection has worsened. How is this diagnosed? This condition is diagnosed based on your medical history and a physical exam. You may also have other tests, including:  Urine tests.  Blood tests.  Tests for STIs (sexually transmitted infections). If you have had more than one UTI, a cystoscopy or imaging studies may be done to determine the cause of the infections. How is this treated? Treatment for this condition includes:  Antibiotic medicine.  Over-the-counter medicines to treat discomfort.  Drinking enough water to stay hydrated. If you have frequent infections or have other conditions such as a kidney stone, you may need to see a health care provider who specializes in the urinary tract (urologist). In rare cases, urinary tract infections can cause sepsis. Sepsis is a life-threatening condition that occurs when the body responds to an infection. Sepsis is treated in the hospital with IV antibiotics, fluids, and other medicines. Follow these instructions at home: Medicines  Take over-the-counter and prescription medicines only as told by your health care provider.  If you were prescribed an antibiotic medicine, take it as told by your health care provider. Do not stop using the antibiotic even if you start to feel better. General instructions  Make sure you: ? Empty your bladder often and completely. Do not hold urine for long periods of time. ? Empty your bladder after   sex. ? Wipe from front to back after urinating or having a bowel movement if you are female. Use each tissue only one time when you wipe.  Drink enough fluid to keep your urine pale yellow.  Keep all follow-up visits. This is important.   Contact a health care provider if:  Your symptoms do not get better after 1-2  days.  Your symptoms go away and then return. Get help right away if:  You have severe pain in your back or your lower abdomen.  You have a fever or chills.  You have nausea or vomiting. Summary  A urinary tract infection (UTI) is an infection of any part of the urinary tract, which includes the kidneys, ureters, bladder, and urethra.  Most urinary tract infections are caused by bacteria in your genital area.  Treatment for this condition often includes antibiotic medicines.  If you were prescribed an antibiotic medicine, take it as told by your health care provider. Do not stop using the antibiotic even if you start to feel better.  Keep all follow-up visits. This is important. This information is not intended to replace advice given to you by your health care provider. Make sure you discuss any questions you have with your health care provider. Document Revised: 08/28/2019 Document Reviewed: 08/28/2019 Elsevier Patient Education  2021 [employer]vier Inc.  

## 2020-07-01 NOTE — Progress Notes (Signed)
Acute Office Visit  Subjective:    Patient ID: Rhonda Anthony, female    DOB: Jul 30, 1970, 50 y.o.   MRN: [id number]  Chief Complaint  Patient presents with  . Urinary Tract Infection    HPI Patient is in today for UTI symptoms for 2 days. She reports lower abdominal pressure, urgency, dysuria, and frequency. Cranberry juice was helpful. She has also been increasing her fluid intake. She feels better today but is still having symptoms. There is a history of UTIs and this feels like a UTI to her. Denies fever, chills, flank pain, vomiting, or hematuria.   Past Surgical History:  Procedure Laterality Date  . ABDOMINAL HYSTERECTOMY    . APPENDECTOMY    . COLONOSCOPY  2014   Normal   . ECTOPIC PREGNANCY SURGERY    . ESOPHAGOGASTRODUODENOSCOPY  2014   Two Ulcers   . EYE SURGERY    . KNEE SURGERY Left   . TUBAL LIGATION      Family History  Problem Relation Age of Onset  . Anuerysm Mother   . Diabetes Unknown   . Colon cancer Neg Hx   . Esophageal cancer Neg Hx   . Stomach cancer Neg Hx     Social History   Socioeconomic History  . Marital status: Married    Spouse name: Not on file  . Number of children: Not on file  . Years of education: Not on file  . Highest education level: Not on file  Occupational History  . Not on file  Tobacco Use  . Smoking status: Former Research scientist (life sciences)  . Smokeless tobacco: Never Used  Substance and Sexual Activity  . Alcohol use: Yes    Comment: occ  . Drug use: No  . Sexual activity: Not on file  Other Topics Concern  . Not on file  Social History Narrative  . Not on file   Social Determinants of Health   Financial Resource Strain: Not on file  Food Insecurity: Not on file  Transportation Needs: Not on file  Physical Activity: Not on file  Stress: Not on file  Social Connections: Not on file  Intimate Partner Violence: Not on file    Outpatient Medications Prior to Visit  Medication Sig Dispense Refill  . Liraglutide  -Weight Management (SAXENDA) 18 MG/3ML SOPN Use 0.6 daily for one week. Then go to 1.2 on dial daily for one week. Then go to 1.8 daily 3 mL 5  . pantoprazole (PROTONIX) 40 MG tablet Take 1 tablet (40 mg total) by mouth daily. For stomach 30 tablet 11   No facility-administered medications prior to visit.    Allergies  Allergen Reactions  . Biaxin [Clarithromycin]     unknown  . Demerol [Meperidine]     unknown  . Nsaids   . Phentermine Palpitations    Review of Systems As per HPI.     Objective:    Physical Exam Vitals and nursing note reviewed.  Constitutional:      General: She is not in acute distress.    Appearance: She is not ill-appearing, toxic-appearing or diaphoretic.  Cardiovascular:     Rate and Rhythm: Normal rate and regular rhythm.  Pulmonary:     Effort: Pulmonary effort is normal. No respiratory distress.  Abdominal:     General: Bowel sounds are normal. There is no distension.     Palpations: Abdomen is soft.     Tenderness: There is no abdominal tenderness. There is no right CVA tenderness, left  CVA tenderness, guarding or rebound.  Neurological:     General: No focal deficit present.     Mental Status: She is alert and oriented to person, place, and time.  Psychiatric:        Mood and Affect: Mood normal.        Behavior: Behavior normal.     BP 128/71   Pulse 78   Temp 97.8 F (36.6 C)   Ht '5\' 4"'  (1.626 m)   Wt 223 lb (101.2 kg)   SpO2 97%   BMI 38.28 kg/m  Wt Readings from Last 3 Encounters:  07/01/20 223 lb (101.2 kg)  06/08/20 228 lb 3.2 oz (103.5 kg)  12/06/17 217 lb (98.4 kg)   Urine dipstick shows positive for ketones.  Micro exam: 0 WBC's per HPF, 0-2 RBC's per HPF and few+ bacteria.   Health Maintenance Due  Topic Date Due  . Pneumococcal Vaccine 14-4 Years old (1 of 4 - PCV13) Never done  . COVID-19 Vaccine (1) Never done  . COLONOSCOPY (Pts 45-38yr Insurance coverage will need to be confirmed)  12/19/2017  . MAMMOGRAM   03/18/2019  . Zoster Vaccines- Shingrix (1 of 2) Never done    There are no preventive care reminders to display for this patient.   Lab Results  Component Value Date   TSH 0.269 (L) 07/21/2014   Lab Results  Component Value Date   WBC 5.5 06/08/2020   HGB 13.4 06/08/2020   HCT 39.6 06/08/2020   MCV 88 06/08/2020   PLT 307 06/08/2020   Lab Results  Component Value Date   NA 140 06/08/2020   K 4.6 06/08/2020   CO2 23 06/08/2020   GLUCOSE 86 06/08/2020   BUN 11 06/08/2020   CREATININE 0.99 06/08/2020   BILITOT 0.4 06/08/2020   ALKPHOS 84 06/08/2020   AST 18 06/08/2020   ALT 16 06/08/2020   PROT 7.0 06/08/2020   ALBUMIN 4.5 06/08/2020   CALCIUM 9.3 06/08/2020   EGFR 69 06/08/2020   GFR 63.66 12/18/2012   Lab Results  Component Value Date   CHOL 161 06/08/2020   Lab Results  Component Value Date   HDL 54 06/08/2020   Lab Results  Component Value Date   LDLCALC 77 06/08/2020   Lab Results  Component Value Date   TRIG 178 (H) 06/08/2020   Lab Results  Component Value Date   CHOLHDL 3.0 06/08/2020   No results found for: HGBA1C     Assessment & Plan:   KMattilynwas seen today for urinary tract infection.  Diagnoses and all orders for this visit:  UTI symptoms UA is not compelling for UTI, however micro does show some bacteria and there is a history of UTIs. Will treat with keflex for now. Culture pending, will discontinue keflex if negative. Return to office for new or worsening symptoms, or if symptoms persist.  -     Urinalysis, Routine w reflex microscopic -     cephALEXin (KEFLEX) 500 MG capsule; Take 1 capsule (500 mg total) by mouth 2 (two) times daily for 7 days. -     Urine Culture  The patient indicates understanding of these issues and agrees with the plan.  TGwenlyn Perking FNP

## 2020-07-01 NOTE — Telephone Encounter (Signed)
Yes, patient needs appointment.

## 2020-07-01 NOTE — Telephone Encounter (Signed)
Does patient need appointment ?  Covering PCP

## 2020-07-03 LAB — URINE CULTURE

## 2020-07-05 ENCOUNTER — Telehealth: Payer: Self-pay | Admitting: Family Medicine

## 2020-07-05 NOTE — Telephone Encounter (Signed)
Pt aware of urine results 

## 2020-07-07 ENCOUNTER — Ambulatory Visit
Admission: RE | Admit: 2020-07-07 | Discharge: 2020-07-07 | Disposition: A | Discharge status: Home / Self Care | Payer: Managed Care, Other (non HMO) | Source: Ambulatory Visit | Attending: Family Medicine | Admitting: Family Medicine

## 2020-07-07 ENCOUNTER — Other Ambulatory Visit: Payer: Self-pay

## 2020-07-07 DIAGNOSIS — Z1231 Encounter for screening mammogram for malignant neoplasm of breast: Secondary | ICD-10-CM

## 2020-07-20 ENCOUNTER — Telehealth: Payer: Self-pay | Admitting: Family Medicine

## 2020-07-20 NOTE — Telephone Encounter (Signed)
Patient has appointment 6/27, advised will discuss at that time

## 2020-07-20 NOTE — Telephone Encounter (Signed)
Pt. Needs to be seen to consider other options Thanks, WS

## 2020-07-25 ENCOUNTER — Other Ambulatory Visit: Payer: Self-pay

## 2020-07-25 ENCOUNTER — Ambulatory Visit: Payer: Managed Care, Other (non HMO) | Admitting: Family Medicine

## 2020-07-25 ENCOUNTER — Encounter: Payer: Self-pay | Admitting: Family Medicine

## 2020-07-25 VITALS — BP 125/85 | HR 60 | Temp 97.7°F | Ht 64.0 in | Wt 227.0 lb

## 2020-07-25 DIAGNOSIS — R5383 Other fatigue: Secondary | ICD-10-CM

## 2020-07-25 DIAGNOSIS — K2100 Gastro-esophageal reflux disease with esophagitis, without bleeding: Secondary | ICD-10-CM | POA: Diagnosis not present

## 2020-07-25 MED ORDER — OZEMPIC (0.25 OR 0.5 MG/DOSE) 2 MG/1.5ML ~~LOC~~ SOPN
0.5000 mg | PEN_INJECTOR | SUBCUTANEOUS | 2 refills | Status: DC
Start: 2020-07-25 — End: 2020-08-10

## 2020-07-25 NOTE — Progress Notes (Signed)
Subjective:  Patient ID: Rhonda Anthony, female    DOB: 07-16-1970  Age: 50 y.o. MRN: [id number]  CC: Follow-up   HPI Rhonda Anthony presents for side effects of saxenda have made her discontinue it recently. Was at the 1.8 mg dose. Was helping her not be as hungry. Was on 1600 calorie diet.   Patient in for follow-up of GERD. Currently asymptomatic taking  PPI daily. There is no chest pain or heartburn. No hematemesis and no melena. No dysphagia or choking. Onset is remote. Progression is stable. Complicating factors, none.   Depression screen [facility] 2/9 07/25/2020 07/01/2020 06/08/2020  Decreased Interest 2 0 0  Down, Depressed, Hopeless 0 0 0  PHQ - 2 Score 2 0 0  Altered sleeping 1 2 -  Tired, decreased energy 1 0 -  Change in appetite 0 0 -  Feeling bad or failure about yourself  0 0 -  Trouble concentrating 0 0 -  Moving slowly or fidgety/restless 0 0 -  Suicidal thoughts 0 0 -  PHQ-9 Score 4 2 -  Difficult doing work/chores Not difficult at all Not difficult at all -    History Rhonda Anthony has a past medical history of Abdominal pain, left lower quadrant (07/01/2007), Chest pain (06/26/2012), GERD (gastroesophageal reflux disease), Internal hemorrhoids without mention of complication (2009), Stomach ulcer, and Stress at work (06/26/2012).   Rhonda Anthony has a past surgical history that includes Appendectomy; Abdominal hysterectomy; Eye surgery; Tubal ligation; Ectopic pregnancy surgery; Knee surgery (Left); Colonoscopy (2014); and Esophagogastroduodenoscopy (2014).   Her family history includes Anuerysm in her mother; Diabetes in her unknown relative.Rhonda Anthony reports that Rhonda Anthony has quit smoking. Rhonda Anthony has never used smokeless tobacco. Rhonda Anthony reports current alcohol use. Rhonda Anthony reports that Rhonda Anthony does not use drugs.    ROS Review of Systems  Constitutional:  Positive for fatigue.  HENT: Negative.    Eyes:  Negative for visual disturbance.  Respiratory:  Negative for shortness of breath.    Cardiovascular:  Negative for chest pain.  Gastrointestinal:  Positive for abdominal pain.  Musculoskeletal:  Negative for arthralgias.  noncontributory Objective:  BP 125/85   Pulse 60   Temp 97.7 F (36.5 C)   Ht 5\' 4"  (1.626 m)   Wt 227 lb (103 kg)   SpO2 97%   BMI 38.96 kg/m   BP Readings from Last 3 Encounters:  07/25/20 125/85  07/01/20 128/71  06/08/20 122/71    Wt Readings from Last 3 Encounters:  07/25/20 227 lb (103 kg)  07/01/20 223 lb (101.2 kg)  06/08/20 228 lb 3.2 oz (103.5 kg)     Physical Exam Constitutional:      General: Rhonda Anthony is not in acute distress.    Appearance: Rhonda Anthony is well-developed.  Cardiovascular:     Rate and Rhythm: Normal rate and regular rhythm.  Pulmonary:     Breath sounds: Normal breath sounds.  Musculoskeletal:        General: Normal range of motion.  Skin:    General: Skin is warm and dry.  Neurological:     Mental Status: Rhonda Anthony is alert and oriented to person, place, and time.      Assessment & Plan:   Rhonda Anthony was seen today for follow-up.  Diagnoses and all orders for this visit:  Fatigue, unspecified type -     TSH + free T4  Other orders -     Semaglutide,0.25 or 0.5MG /DOS, (OZEMPIC, 0.25 OR 0.5 MG/DOSE,) 2 MG/1.5ML SOPN; Inject 0.5 mg into the  skin once a week.      I have discontinued Rhonda Anthony "Tina"'s Saxenda. I am also having her start on Ozempic (0.25 or 0.5 MG/DOSE). Additionally, I am having her maintain her pantoprazole.  Allergies as of 07/25/2020       Reactions   Biaxin [clarithromycin]    unknown   Demerol [meperidine]    unknown   Nsaids    Phentermine Palpitations        Medication List        Accurate as of July 25, 2020  6:10 PM. If you have any questions, ask your nurse or doctor.          STOP taking these medications    Saxenda 18 MG/3ML Sopn Generic drug: Liraglutide -Weight Management Stopped by: Rhonda Claude, MD       TAKE these medications     Ozempic (0.25 or 0.5 MG/DOSE) 2 MG/1.5ML Sopn Generic drug: Semaglutide(0.25 or 0.5MG /DOS) Inject 0.5 mg into the skin once a week. Started by: Rhonda Claude, MD   pantoprazole 40 MG tablet Commonly known as: PROTONIX Take 1 tablet (40 mg total) by mouth daily. For stomach         Follow-up: Return in about 6 weeks (around 09/05/2020).  Rhonda Anthony, M.D.

## 2020-07-26 ENCOUNTER — Telehealth: Payer: Self-pay | Admitting: *Deleted

## 2020-07-26 LAB — TSH+FREE T4
Free T4: 1.3 ng/dL (ref 0.82–1.77)
TSH: 0.665 u[IU]/mL (ref 0.450–4.500)

## 2020-07-26 NOTE — Telephone Encounter (Signed)
Rhonda Anthony (Rhonda Anthony) Rx #: [id number] Ozempic (0.25 or 0.5 MG/DOSE) 2MG /1.5ML pen-injector  Sent to plan

## 2020-07-26 NOTE — Progress Notes (Signed)
Hello Rhonda Anthony,  Your lab result is normal and/or stable.Some minor variations that are not significant are commonly marked abnormal, but do not represent any medical problem for you.  Best regards, Mechele Claude, M.D.

## 2020-07-27 ENCOUNTER — Encounter: Payer: Self-pay | Admitting: Family Medicine

## 2020-08-02 NOTE — Telephone Encounter (Signed)
[id number]

## 2020-08-03 NOTE — Telephone Encounter (Signed)
Have her see Raynelle Fanning. She can frequently get this covered.

## 2020-08-10 ENCOUNTER — Other Ambulatory Visit: Payer: Self-pay | Admitting: Family Medicine

## 2020-08-10 MED ORDER — SAXENDA 18 MG/3ML ~~LOC~~ SOPN: 1.8000 mg | PEN_INJECTOR | Freq: Every day | SUBCUTANEOUS | 3 refills | Status: DC

## 2020-08-10 MED ORDER — TRULICITY 0.75 MG/0.5ML ~~LOC~~ SOAJ
0.7500 mg | SUBCUTANEOUS | 2 refills | Status: DC
Start: 2020-08-10 — End: 2020-08-10

## 2020-08-10 NOTE — Telephone Encounter (Signed)
Yes, I did a scrip.

## 2020-08-10 NOTE — Telephone Encounter (Signed)
Insurance will not pay for trulicity can we try saxenda?

## 2021-07-01 ENCOUNTER — Encounter (HOSPITAL_COMMUNITY): Payer: Self-pay

## 2021-07-01 ENCOUNTER — Emergency Department (HOSPITAL_COMMUNITY)
Admission: EM | Admit: 2021-07-01 | Discharge: 2021-07-01 | Disposition: A | Discharge status: Home / Self Care | Payer: Managed Care, Other (non HMO) | Attending: Emergency Medicine | Admitting: Emergency Medicine

## 2021-07-01 ENCOUNTER — Emergency Department (HOSPITAL_COMMUNITY): Payer: Managed Care, Other (non HMO)

## 2021-07-01 ENCOUNTER — Other Ambulatory Visit: Payer: Self-pay

## 2021-07-01 DIAGNOSIS — I4891 Unspecified atrial fibrillation: Secondary | ICD-10-CM | POA: Diagnosis not present

## 2021-07-01 DIAGNOSIS — R0789 Other chest pain: Secondary | ICD-10-CM | POA: Diagnosis present

## 2021-07-01 DIAGNOSIS — R079 Chest pain, unspecified: Secondary | ICD-10-CM

## 2021-07-01 LAB — TROPONIN I (HIGH SENSITIVITY)
Troponin I (High Sensitivity): 7 ng/L (ref <18)
Troponin I (High Sensitivity): 10 ng/L (ref <18)

## 2021-07-01 LAB — CBC
HCT: 44.3 % (ref 36.0–46.0)
Hemoglobin: 14.2 g/dL (ref 12.0–15.0)
MCH: 28.9 pg (ref 26.0–34.0)
MCHC: 32.1 g/dL (ref 30.0–36.0)
MCV: 90.2 fL (ref 80.0–100.0)
Platelets: 300 10*3/uL (ref 150–400)
RBC: 4.91 MIL/uL (ref 3.87–5.11)
RDW: 13.2 % (ref 11.5–15.5)
WBC: 9.7 10*3/uL (ref 4.0–10.5)
nRBC: 0 % (ref 0.0–0.2)

## 2021-07-01 LAB — CBG MONITORING, ED: Glucose-Capillary: 73 mg/dL (ref 70–99)

## 2021-07-01 LAB — BASIC METABOLIC PANEL
Anion gap: 6 (ref 5–15)
BUN: 22 mg/dL — ABNORMAL HIGH (ref 6–20)
CO2: 28 mmol/L (ref 22–32)
Calcium: 8.8 mg/dL — ABNORMAL LOW (ref 8.9–10.3)
Chloride: 106 mmol/L (ref 98–111)
Creatinine, Ser: 0.97 mg/dL (ref 0.44–1.00)
GFR, Estimated: >60 mL/min (ref >60)
Glucose, Bld: 85 mg/dL (ref 70–99)
Potassium: 3.6 mmol/L (ref 3.5–5.1)
Sodium: 140 mmol/L (ref 135–145)

## 2021-07-01 MED ORDER — METOPROLOL TARTRATE 25 MG PO TABS: 25.0000 mg | ORAL_TABLET | ORAL | 0 refills | Status: DC | PRN

## 2021-07-01 MED ORDER — METOPROLOL SUCCINATE ER 25 MG PO TB24: 25.0000 mg | ORAL_TABLET | Freq: Every day | ORAL | 0 refills | Status: DC

## 2021-07-01 MED ORDER — ASPIRIN 81 MG PO CHEW
324.0000 mg | CHEWABLE_TABLET | Freq: Once | ORAL | Status: DC
  Filled 2021-07-01: qty 4

## 2021-07-01 MED ORDER — NITROGLYCERIN 0.4 MG SL SUBL: 0.4000 mg | SUBLINGUAL_TABLET | SUBLINGUAL | Status: DC | PRN

## 2021-07-01 MED ORDER — SODIUM CHLORIDE 0.9 % IV SOLN: INTRAVENOUS | Status: DC

## 2021-07-01 NOTE — Discharge Instructions (Addendum)
Your testing in the emergency department was reassuring and did not show any signs of heart attack however the symptoms that you are having were concerning and were possibly related to the rapid heartbeat that you are having at the time.  Because of this I would like for you to follow-up with your family doctor, you should also follow-up with the heart doctor and I have included their phone number above.  Please call the phone number on Monday morning to make a rapid follow-up however I would like for you to return to the emergency department immediately for severe or worsening symptoms including shortness of breath or chest pain that is getting worse or becoming more frequent.  Until which time that you see the cardiologist in the clinic I would like for you to start taking a baby aspirin every day  When I discussed your care with the cardiologist they recommended that you take metoprolol once a day, this is metoprolol XL.  Please take this regularly until you see the cardiologist  There is a second prescription for metoprolol but this is the normal form, not XL, you are only to take this medication once if you feel like your heart is racing.  The cardiology office will be calling you to set up an appointment.

## 2021-07-01 NOTE — ED Provider Notes (Signed)
[facility] EMERGENCY DEPARTMENT Provider Note   CSN: [id number] Arrival date & time: 07/01/21  1702     History  Chief Complaint  Patient presents with   Chest Pain    Rhonda Anthony is a 51 y.o. female.   Chest Pain  This patient is a 51 year old female, she reports to me that she has no chronic medical conditions, that being said she does take pantoprazole for acid reflux occasionally.  She presents today with a complaint of chest discomfort describes as a heaviness on her chest associated with shortness of breath, feeling like her heart was racing but also feeling like her left arm was feeling abnormal, this was quite intense, she tried to walk up to the house and was severely short of breath during this time, when the paramedics arrived they found her heart rate to be elevated in the 150s and what appeared to be atrial fibrillation, this converted spontaneously while she was in the ambulance however she reports that even at this time she still has an heaviness on her chest and some abnormal feelings in her arm.  Additionally she reports that over the last several weeks she has had some exertional shortness of breath albeit not to the level of today.  She does not exertional very often, she is not very active, she recently went on a short trip to [place] but it was a short plane ride, no other risk factors for pulmonary embolism.  The patient has no diagnosis of hypertension high cholesterol or diabetes, she does not smoke cigarettes she drinks occasional alcohol, she does not use any stimulants either illegal or illegal except for some over-the-counter allergy medications as needed.  She has no significant family history of heart disease other than a grandfather but she is not clear on the details.  Home Medications Prior to Admission medications   Medication Sig Start Date End Date Taking? Authorizing Provider  Cholecalciferol (VITAMIN D3) 25 MCG (1000 UT) CAPS Take 1 capsule by  mouth daily.   Yes [provider]  loratadine (CLARITIN) 10 MG tablet Take 10 mg by mouth daily.   Yes [provider]  metoprolol succinate (TOPROL-XL) 25 MG 24 hr tablet Take 1 tablet (25 mg total) by mouth daily. 07/01/21  Yes Noemi Chapel, MD  metoprolol tartrate (LOPRESSOR) 25 MG tablet Take 1 tablet (25 mg total) by mouth as needed (racing heart beat). 07/01/21 07/31/21 Yes Noemi Chapel, MD      Allergies    Biaxin [clarithromycin], Demerol [meperidine], Nsaids, and Phentermine    Review of Systems   Review of Systems  Cardiovascular:  Positive for chest pain.  All other systems reviewed and are negative.  Physical Exam Updated Vital Signs BP (!) 104/50   Pulse 70   Resp 13   Ht 1.626 m (5\' 4" )   Wt 99.8 kg   SpO2 100%   BMI 37.76 kg/m  Physical Exam Vitals and nursing note reviewed.  Constitutional:      General: She is not in acute distress.    Appearance: She is well-developed.  HENT:     Head: Normocephalic and atraumatic.     Mouth/Throat:     Pharynx: No oropharyngeal exudate.  Eyes:     General: No scleral icterus.       Right eye: No discharge.        Left eye: No discharge.     Conjunctiva/sclera: Conjunctivae normal.     Pupils: Pupils are equal, round, and reactive  to light.  Neck:     Thyroid: No thyromegaly.     Vascular: No JVD.  Cardiovascular:     Rate and Rhythm: Normal rate and regular rhythm.     Heart sounds: Normal heart sounds. No murmur heard.   No friction rub. No gallop.  Pulmonary:     Effort: Pulmonary effort is normal. No respiratory distress.     Breath sounds: Normal breath sounds. No wheezing or rales.  Abdominal:     General: Bowel sounds are normal. There is no distension.     Palpations: Abdomen is soft. There is no mass.     Tenderness: There is no abdominal tenderness.  Musculoskeletal:        General: No tenderness. Normal range of motion.     Cervical back: Normal range of motion and neck supple.      Right lower leg: No edema.     Left lower leg: No edema.  Lymphadenopathy:     Cervical: No cervical adenopathy.  Skin:    General: Skin is warm and dry.     Findings: No erythema or rash.  Neurological:     Mental Status: She is alert.     Coordination: Coordination normal.  Psychiatric:        Behavior: Behavior normal.    ED Results / Procedures / Treatments   Labs (all labs ordered are listed, but only abnormal results are displayed) Labs Reviewed  BASIC METABOLIC PANEL - Abnormal; Notable for the following components:      Result Value   BUN 22 (*)    Calcium 8.8 (*)    All other components within normal limits  CBC  CBG MONITORING, ED  TROPONIN I (HIGH SENSITIVITY)  TROPONIN I (HIGH SENSITIVITY)    EKG EKG Interpretation  Date/Time:  Saturday July 01 2021 17:26:15 EDT Ventricular Rate:  93 PR Interval:  162 QRS Duration: 81 QT Interval:  349 QTC Calculation: 435 R Axis:   17 Text Interpretation: Sinus rhythm Consider anterior infarct since last tracing no significant change Confirmed by Noemi Chapel [id number]) on 07/01/2021 5:49:00 PM  Radiology DG Chest Portable 1 View  Result Date: 07/01/2021 CLINICAL DATA:  Acute chest pain and shortness of breath. EXAM: PORTABLE CHEST 1 VIEW COMPARISON:  06/24/2012 chest radiograph FINDINGS: The cardiomediastinal silhouette is unremarkable. There is no evidence of focal airspace disease, pulmonary edema, suspicious pulmonary nodule/mass, pleural effusion, or pneumothorax. No acute bony abnormalities are identified. IMPRESSION: No active disease. Electronically Signed   By: Margarette Canada M.D.   On: 07/01/2021 18:11    Procedures Procedures    Medications Ordered in ED Medications  aspirin chewable tablet 324 mg (324 mg Oral Not Given 07/01/21 1841)  0.9 %  sodium chloride infusion (0 mLs Intravenous Stopped 07/01/21 2010)  nitroGLYCERIN (NITROSTAT) SL tablet 0.4 mg (has no administration in time range)    ED Course/ Medical  Decision Making/ A&P                           Medical Decision Making Amount and/or Complexity of Data Reviewed Labs: ordered. Radiology: ordered.  Risk OTC drugs. Prescription drug management.   This patient presents to the ED for concern of chest pain and shortness of breath, this involves an extensive number of treatment options, and is a complaint that carries with it a high risk of complications and morbidity.  The differential diagnosis includes primarily acute coronary syndrome given the exertional  symptoms over the last few weeks and the symptoms today, would also consider pulmonary embolism though much less likely as she is not tachycardic or hypoxic.  Would consider pericarditis, myocarditis or even pneumothorax though her lung sounds are clear.  Complicating this is the fact that she recently had some type of respiratory illness and was treated with an antibiotic.  She is not coughing at this time.   Co morbidities that complicate the patient evaluation  None   Additional history obtained:  Additional history obtained from electronic medical record External records from outside source obtained and reviewed including office visits, she is currently following with her family doctor and has been on [place] for weight loss   Lab Tests:  I Ordered, and personally interpreted labs.  The pertinent results include: Negative troponin followed by a second negative troponin, there is no significant increase.  CBC and metabolic panel were overall unremarkable   Imaging Studies ordered:  I ordered imaging studies including chest x-ray I independently visualized and interpreted imaging which showed no acute findings I agree with the radiologist interpretation   Cardiac Monitoring: / EKG:  The patient was maintained on a cardiac monitor.  I personally viewed and interpreted the cardiac monitored which showed an underlying rhythm of: Monitoring was normal sinus rhythm however the  prehospital EKG was evaluated by myself and shows that she has been in atrial fibrillation with a rapid ventricular response, she is no longer in that rhythm and appears to have spontaneously converted back to sinus rhythm.   Consultations Obtained:  I requested consultation with the cardiologist,  and discussed lab and imaging findings as well as pertinent plan -they recommend that the patient should be following up as an outpatient and they will contact the patient to make a follow-up for her., the patient was referred to the cardiology service as an outpatient.  They request that she be started on a low-dose of metoprolol for home to try to prevent recurrent A-fib with RVR  Problem List / ED Course / Critical interventions / Medication management  Metoprolol I ordered medication including metoprolol for prevention of A-fib Reevaluation of the patient after these medicines showed that the patient improved I have reviewed the patients home medicines and have made adjustments as needed   Social Determinants of Health:  None   Test / Admission - Considered:  Considered admission but after discussion with cardiology, the patient's low heart score, the patient's low CHA2DS2-VASc score she otherwise appears very stable and now that she is symptom-free stable for discharge.         Final Clinical Impression(s) / ED Diagnoses Final diagnoses:  Atrial fibrillation with rapid ventricular response (HCC)  Chest pain, unspecified type    Rx / DC Orders ED Discharge Orders          Ordered    metoprolol tartrate (LOPRESSOR) 25 MG tablet  As needed        07/01/21 2216    metoprolol succinate (TOPROL-XL) 25 MG 24 hr tablet  Daily        07/01/21 2216              Noemi Chapel, MD 07/01/21 2217

## 2021-07-01 NOTE — ED Triage Notes (Signed)
Pt brought in by [facility]. Pt was swimming, began having chest pressure and palpitations, SHOB. Pt denies cardiac history, Per ems HR elevated to 150's, back down to 90's once in ambulance.

## 2021-07-03 ENCOUNTER — Emergency Department (HOSPITAL_COMMUNITY): Payer: Managed Care, Other (non HMO)

## 2021-07-03 ENCOUNTER — Encounter (HOSPITAL_COMMUNITY): Payer: Self-pay | Admitting: Emergency Medicine

## 2021-07-03 ENCOUNTER — Emergency Department (HOSPITAL_COMMUNITY)
Admission: EM | Admit: 2021-07-03 | Discharge: 2021-07-03 | Disposition: A | Discharge status: Home / Self Care | Payer: Managed Care, Other (non HMO) | Attending: Emergency Medicine | Admitting: Emergency Medicine

## 2021-07-03 DIAGNOSIS — R471 Dysarthria and anarthria: Secondary | ICD-10-CM | POA: Diagnosis not present

## 2021-07-03 DIAGNOSIS — R002 Palpitations: Secondary | ICD-10-CM | POA: Insufficient documentation

## 2021-07-03 DIAGNOSIS — R531 Weakness: Secondary | ICD-10-CM | POA: Diagnosis not present

## 2021-07-03 DIAGNOSIS — R0789 Other chest pain: Secondary | ICD-10-CM | POA: Diagnosis present

## 2021-07-03 DIAGNOSIS — R519 Headache, unspecified: Secondary | ICD-10-CM | POA: Insufficient documentation

## 2021-07-03 DIAGNOSIS — R079 Chest pain, unspecified: Secondary | ICD-10-CM

## 2021-07-03 LAB — MAGNESIUM: Magnesium: 2.2 mg/dL (ref 1.7–2.4)

## 2021-07-03 LAB — CBC
HCT: 41.8 % (ref 36.0–46.0)
Hemoglobin: 13.6 g/dL (ref 12.0–15.0)
MCH: 29.7 pg (ref 26.0–34.0)
MCHC: 32.5 g/dL (ref 30.0–36.0)
MCV: 91.3 fL (ref 80.0–100.0)
Platelets: 298 10*3/uL (ref 150–400)
RBC: 4.58 MIL/uL (ref 3.87–5.11)
RDW: 13.2 % (ref 11.5–15.5)
WBC: 6.8 10*3/uL (ref 4.0–10.5)
nRBC: 0 % (ref 0.0–0.2)

## 2021-07-03 LAB — TROPONIN I (HIGH SENSITIVITY)
Troponin I (High Sensitivity): 4 ng/L (ref <18)
Troponin I (High Sensitivity): 4 ng/L (ref <18)

## 2021-07-03 LAB — BASIC METABOLIC PANEL
Anion gap: 7 (ref 5–15)
BUN: 17 mg/dL (ref 6–20)
CO2: 28 mmol/L (ref 22–32)
Calcium: 9.2 mg/dL (ref 8.9–10.3)
Chloride: 106 mmol/L (ref 98–111)
Creatinine, Ser: 1.23 mg/dL — ABNORMAL HIGH (ref 0.44–1.00)
GFR, Estimated: 53 mL/min — ABNORMAL LOW (ref >60)
Glucose, Bld: 90 mg/dL (ref 70–99)
Potassium: 4.1 mmol/L (ref 3.5–5.1)
Sodium: 141 mmol/L (ref 135–145)

## 2021-07-03 LAB — TSH: TSH: 1.059 u[IU]/mL (ref 0.350–4.500)

## 2021-07-03 NOTE — ED Notes (Signed)
Got patient on the monitor into A GOWN PATIENT IS RESTING WITH FAMILY AT BEDSIDE And CALL BELL IN REACH

## 2021-07-03 NOTE — ED Provider Triage Note (Signed)
Emergency Medicine Provider Triage Evaluation Note  Rhonda Anthony , a 51 y.o. female  was evaluated in triage.  Pt complains of chest pain described as pressure.  She states episode occurred earlier today when she was not able to move her left arm, had chest pressure that radiated down left arm and bilateral jaw.  She was recently seen at the emergency department this past Saturday for similar symptoms was not able to pick up her medicines due to the pharmacy being closed.  Today similar episode happened when she had difficulty speaking, left arm weakness, chest pressure with radiation in the same places, and blurry vision.  She took 4 baby aspirin on the way to emergency department.  Associated symptoms include shortness of breath.  Symptoms resolved in transit.  Upon exiting the room with the patient, she began to feel similar symptoms. Denies fever, chills, night sweats, abdominal pain, N/V/D, urinary/vaginal symptoms, change in bowel habits.   Review of Systems  Positive: See above Negative:   Physical Exam  BP (!) 150/67 (BP Location: Right Arm)   Pulse 63   Temp 97.9 F (36.6 C)   Resp 16   SpO2 99%  Gen:   Awake, no distress   Resp:  Normal effort  MSK:   Moves extremities without difficulty  Other:  Obvious pupillary differences with left pupil significantly larger than right.  Daughter is in the room and states this is normal after childhood accident with glass.  Cranial nerves III through XII grossly intact no pronator drift or abnormal finger-nose.  Upon leaving the room, patient states that she began to have symptoms again of chest pressure with left arm radiation.  Medical Decision Making  Medically screening exam initiated at 11:23 AM.  Appropriate orders placed.  YISSEL HABERMEHL was informed that the remainder of the evaluation will be completed by another provider, this initial triage assessment does not replace that evaluation, and the importance of remaining in the ED  until their evaluation is complete.     Peter Garter, [place] 07/03/21 1132

## 2021-07-03 NOTE — ED Notes (Signed)
Pt verbalizes understanding of discharge instructions. Opportunity for questions and answers were provided. Pt discharged from the ED.   ?

## 2021-07-03 NOTE — ED Triage Notes (Addendum)
Patient here with complaint of intermittent chest pain that radiates into her jaw and face that started last Saturday. Pain is described as a pressure. Patient is alert, oriented, and in no apparent distress at this time. Patient states she took 324mg  aspirin PTA.

## 2021-07-03 NOTE — ED Notes (Signed)
In xray

## 2021-07-03 NOTE — Discharge Instructions (Addendum)
You came to the emergency department today to be evaluated for your chest pain.  Your physical exam and lab results were reassuring.  You are scheduled with an appointment at the atrial fibrillation clinic tomorrow at 2 PM.  Please follow-up with the clinic.  Please do not take any metoprolol medication until you can follow-up with the cardiology team.  Get help right away if: Your chest pain gets worse. You have a cough that gets worse, or you cough up blood. You have severe pain in your abdomen. You faint. You have sudden, unexplained chest discomfort. You have sudden, unexplained discomfort in your arms, back, neck, or jaw. You have shortness of breath at any time. You suddenly start to sweat, or your skin gets clammy. You feel nausea or you vomit. You suddenly feel lightheaded or dizzy. You have severe weakness, or unexplained weakness or fatigue. Your heart begins to beat quickly, or it feels like it is skipping beats.

## 2021-07-03 NOTE — ED Provider Notes (Signed)
[facility] EMERGENCY DEPARTMENT Provider Note   CSN: [id number] Arrival date & time: 07/03/21  1042     History  Chief Complaint  Patient presents with   Chest Pain    Rhonda Anthony is a 51 y.o. female with past medical history of A-fib with RVR.  Patient presents to the emergency department with a chief complaint of chest pain.  Patient reports that she woke this morning at 630 with chest pain to the left side of her chest.  Patient noted that her Apple Watch that her heart rate was in the 130s at this time.  Chest pain radiates to left jaw and left arm.  Pain is described as a heaviness.  Patient has had constant pain for most of the morning.  Pain did resolve while in route to the emergency department however she had another episode while in the emergency department.  Patient also reports that she had episode of palpitations upon first arriving at the emergency department.  Patient has noted that chest pain is worse with exertion stress is walking around the house.  Patient took 324 mg of aspirin.  Patient denies any chest pain at present.  Patient does endorse a headache.  Patient reports that she had similar episodes of chest pain and palpitations on 6/3 was found to be in A-fib with RVR.  Patient was prescribed metoprolol however has not been able to pick up this medication she started in the outpatient setting.  Patient told previous provider in triage that she had difficulty speaking and left arm weakness.  When I speak to patient she denies having any difficulty speaking or focal neurological deficits such as weakness or numbness.  Patient denies any cigarette use, family history of cardiac events prior to the age of 24, hypertension, diabetes, high cholesterol, CVA, hormone therapy use, surgery last 4 weeks, history of DVT/PE.   Chest Pain     Home Medications Prior to Admission medications   Medication Sig Start Date End Date Taking? Authorizing  Provider  Cholecalciferol (VITAMIN D3) 25 MCG (1000 UT) CAPS Take 1 capsule by mouth daily.    [provider]  loratadine (CLARITIN) 10 MG tablet Take 10 mg by mouth daily.    [provider]  metoprolol succinate (TOPROL-XL) 25 MG 24 hr tablet Take 1 tablet (25 mg total) by mouth daily. 07/01/21   Noemi Chapel, MD  metoprolol tartrate (LOPRESSOR) 25 MG tablet Take 1 tablet (25 mg total) by mouth as needed (racing heart beat). 07/01/21 07/31/21  Noemi Chapel, MD      Allergies    Biaxin [clarithromycin], Demerol [meperidine], Nsaids, and Phentermine    Review of Systems   Review of Systems  Cardiovascular:  Positive for chest pain.   Physical Exam Updated Vital Signs BP 135/90   Pulse (!) 59   Temp 97.9 F (36.6 C)   Resp 10   SpO2 100%  Physical Exam  ED Results / Procedures / Treatments   Labs (all labs ordered are listed, but only abnormal results are displayed) Labs Reviewed  BASIC METABOLIC PANEL - Abnormal; Notable for the following components:      Result Value   Creatinine, Ser 1.23 (*)    GFR, Estimated 53 (*)    All other components within normal limits  CBC  MAGNESIUM  TSH  TROPONIN I (HIGH SENSITIVITY)  TROPONIN I (HIGH SENSITIVITY)    EKG EKG Interpretation  Date/Time:  Monday July 03 2021 11:39:02 EDT Ventricular Rate:  61 PR Interval:  148 QRS Duration: 76 QT Interval:  392 QTC Calculation: 394 R Axis:   26 Text Interpretation: Normal sinus rhythm Low voltage QRS Cannot rule out Anterior infarct , age undetermined Abnormal ECG When compared with ECG of 03-Jul-2021 10:55, No significant change since last tracing EARLIER SAME DATE Confirmed by Pattricia Boss [phone]) on 07/03/2021 11:55:43 AM  Radiology DG Chest 2 View  Result Date: 07/03/2021 CLINICAL DATA:  Chest pain and pressure EXAM: CHEST - 2 VIEW COMPARISON:  07/01/2021 FINDINGS: Heart size upper limits of normal. Mediastinal shadows are normal. The lungs are clear. The vascularity  is normal. No effusions. No abnormal bone finding. IMPRESSION: No active cardiopulmonary disease. Electronically Signed   By: Nelson Chimes M.D.   On: 07/03/2021 11:42   DG Chest Portable 1 View  Result Date: 07/01/2021 CLINICAL DATA:  Acute chest pain and shortness of breath. EXAM: PORTABLE CHEST 1 VIEW COMPARISON:  06/24/2012 chest radiograph FINDINGS: The cardiomediastinal silhouette is unremarkable. There is no evidence of focal airspace disease, pulmonary edema, suspicious pulmonary nodule/mass, pleural effusion, or pneumothorax. No acute bony abnormalities are identified. IMPRESSION: No active disease. Electronically Signed   By: Margarette Canada M.D.   On: 07/01/2021 18:11    Procedures Procedures    Medications Ordered in ED Medications - No data to display  ED Course/ Medical Decision Making/ A&P                           Medical Decision Making Amount and/or Complexity of Data Reviewed Labs: ordered. Radiology: ordered.   Alert 51 year old female in no acute distress, nontoxic-appearing.  Presents to the ED with a chief complaint of chest pain.  Information obtained from patient and patient's family members at bedside.  I reviewed patient's past medical records including previous prior notes, labs, and imaging.  Patient has medical history as outlined in HPI which complicates her care.  Patient had recent episode of A-fib with RVR with spontaneous conversion.  Will initiate ACS work-up at this time as well as check magnesium level due to patient's request and TSH.  Patient did tell previous provider in triage that she had episode of left arm weakness and dysarthria.  Patient vehemently denies this.  Neuro exam is reassuring.  Patient does endorse that she has a headache however onset was gradual pain grossly worse over time, no sudden onset.  Low suspicion for [facility] or CVA at this time.  I personally viewed and interpreted patient's EKG.  Tracing shows normal sinus rhythm.  I  personally viewed and interpreted patient's chest x-ray.  Tracing shows no active cardiopulmonary disease.  I personally viewed and interpreted patient's lab results.  Pertinent findings include: -Creatinine 1.23 -Troponin 4 and 4 delta of 0, -CBC, magnesium, TSH unremarkable  Patient has no further episodes of chest pain while in the emergency department.  Due to recent episode of A-fib with RVR and reported heart rate in the 130s this morning we will consult cardiology.  I spoke with Dr. Berline Lopes who advised that patient does not need to be started on anticoagulation at this time.  She advised that patient will be scheduled to follow-up in A-fib clinic tomorrow.  Based on patient's chief complaint, I considered admission might be necessary, however after reassuring ED workup feel patient is reasonable for discharge.  Discussed results, findings, treatment and follow up. Patient advised of return precautions. Patient verbalized understanding and agreed with plan.  Portions of  this note were generated with [employer]. Dictation errors may occur despite best attempts at proofreading.         Final Clinical Impression(s) / ED Diagnoses Final diagnoses:  Chest pain, unspecified type    Rx / DC Orders ED Discharge Orders     None         Dyann Ruddle 07/03/21 1532    Pattricia Boss, MD 07/04/21 1348

## 2021-07-04 ENCOUNTER — Telehealth (HOSPITAL_COMMUNITY): Payer: Self-pay

## 2021-07-04 ENCOUNTER — Encounter (HOSPITAL_COMMUNITY): Payer: Self-pay | Admitting: Physician Assistant

## 2021-07-04 ENCOUNTER — Ambulatory Visit (HOSPITAL_COMMUNITY)
Admit: 2021-07-04 | Discharge: 2021-07-04 | Disposition: A | Discharge status: Home / Self Care | Payer: Managed Care, Other (non HMO) | Attending: Physician Assistant | Admitting: Physician Assistant

## 2021-07-04 VITALS — BP 122/76 | HR 58 | Ht 64.0 in | Wt 220.6 lb

## 2021-07-04 DIAGNOSIS — Z6837 Body mass index (BMI) 37.0-37.9, adult: Secondary | ICD-10-CM | POA: Insufficient documentation

## 2021-07-04 DIAGNOSIS — E669 Obesity, unspecified: Secondary | ICD-10-CM | POA: Diagnosis not present

## 2021-07-04 DIAGNOSIS — R079 Chest pain, unspecified: Secondary | ICD-10-CM | POA: Diagnosis not present

## 2021-07-04 DIAGNOSIS — I480 Paroxysmal atrial fibrillation: Secondary | ICD-10-CM | POA: Diagnosis not present

## 2021-07-04 MED ORDER — METOPROLOL TARTRATE 25 MG PO TABS: 25.0000 mg | ORAL_TABLET | Freq: Three times a day (TID) | ORAL | 0 refills | Status: DC | PRN

## 2021-07-04 MED ORDER — METOPROLOL SUCCINATE ER 25 MG PO TB24: 12.5000 mg | ORAL_TABLET | Freq: Every day | ORAL | 3 refills | Status: DC

## 2021-07-04 NOTE — Telephone Encounter (Signed)
Initiated prior authorization to [facility] for patient to have a Cardiac CT performed. I spoke with Jasmine. Date of service: 07/14/21 Approval is pending. Faxed clinical information to [phone] Case # [id number] Echocardiogram for date of service 07/14/21 does not require precert.

## 2021-07-04 NOTE — Patient Instructions (Signed)
Start metoprolol succinate 1/2 tablet once a day  Use metoprolol tartrate only as needed for heart rates staying over 100 - can repeat every 8 hours as needed

## 2021-07-04 NOTE — Progress Notes (Signed)
Primary Care Physician: Claretta Fraise, MD Primary Cardiologist: none Primary Electrophysiologist: none Referring Physician: [facility]   Rhonda Anthony is a 51 y.o. female with a history of atrial fibrillation who presents for consultation in the [facility].  The patient was initially diagnosed with atrial fibrillation 07/01/21 after presenting to the ED with symptoms of chest pain, tachypalpitations, and SOB. EMS was called and she was found to be in afib with RVR heart rates ~150 bpm. She converted en route to the ED. EMS run sheet reviewed. Patient was prescribed metoprolol for rate control. She returned to the ED again on 07/03/21 with chest pain and heart racing which again resolved prior to arrival at the ED. She had not started metoprolol yet. She describes her chest pain as a "heaviness" in her chest which radiated to her jaw and left arm. Patient has a CHADS2VASC score of 1.  Today, patient is in SR but still has some fatigue. In hindsight, she has had rare, brief "flutters" in her chest but never sought treatment. She denies significant snoring and drinks alcohol only occasionally.   Today, she denies symptoms of shortness of breath, orthopnea, PND, lower extremity edema, dizziness, presyncope, syncope, snoring, daytime somnolence, bleeding, or neurologic sequela. The patient is tolerating medications without difficulties and is otherwise without complaint today.    Atrial Fibrillation Risk Factors:  she does not have symptoms or diagnosis of sleep apnea. she does not have a history of rheumatic fever. she does have a history of alcohol use. The patient does not have a history of early familial atrial fibrillation or other arrhythmias.  she has a BMI of Body mass index is 37.87 kg/m.Marland Kitchen Filed Weights   07/04/21 1336  Weight: 100.1 kg    Family History  Problem Relation Age of Onset   Anuerysm Mother    Diabetes Unknown    Colon cancer Neg  Hx    Esophageal cancer Neg Hx    Stomach cancer Neg Hx      Atrial Fibrillation Management history:  Previous antiarrhythmic drugs: none Previous cardioversions: none Previous ablations: none CHADS2VASC score: 1 Anticoagulation history: none   Past Medical History:  Diagnosis Date   Abdominal pain, left lower quadrant 07/01/2007   Qualifier: Diagnosis of  By: Nils Pyle CMA (AAMA), Leisha     Chest pain 06/26/2012   GERD (gastroesophageal reflux disease)    Internal hemorrhoids without mention of complication [id number]   Colonoscopy    Stomach ulcer    x 2   Stress at work 06/26/2012   Past Surgical History:  Procedure Laterality Date   ABDOMINAL HYSTERECTOMY     APPENDECTOMY     COLONOSCOPY  2014   Normal    ECTOPIC PREGNANCY SURGERY     ESOPHAGOGASTRODUODENOSCOPY  2014   Two Ulcers    EYE SURGERY     KNEE SURGERY Left    TUBAL LIGATION      Current Outpatient Medications  Medication Sig Dispense Refill   Cholecalciferol (VITAMIN D3) 25 MCG (1000 UT) CAPS Take 1 capsule by mouth daily.     loratadine (CLARITIN) 10 MG tablet Take 10 mg by mouth daily.     metoprolol succinate (TOPROL-XL) 25 MG 24 hr tablet Take 0.5 tablets (12.5 mg total) by mouth daily. 15 tablet 3   metoprolol tartrate (LOPRESSOR) 25 MG tablet Take 1 tablet (25 mg total) by mouth every 8 (eight) hours as needed (racing heart beat). 30 tablet 0  No current facility-administered medications for this encounter.    Allergies  Allergen Reactions   Biaxin [Clarithromycin]     unknown   Demerol [Meperidine]     unknown   Nsaids    Phentermine Palpitations    Social History   Socioeconomic History   Marital status: Married    Spouse name: Not on file   Number of children: Not on file   Years of education: Not on file   Highest education level: Not on file  Occupational History   Not on file  Tobacco Use   Smoking status: Former   Smokeless tobacco: Never   Tobacco comments:    Former smoker  07/04/21  Substance and Sexual Activity   Alcohol use: Yes    Alcohol/week: 3.0 standard drinks    Types: 3 Standard drinks or equivalent per week    Comment: Truly drinks 3 once a week 07/04/21   Drug use: No   Sexual activity: Not on file  Other Topics Concern   Not on file  Social History Narrative   Not on file   Social Determinants of Health   Financial Resource Strain: Not on file  Food Insecurity: Not on file  Transportation Needs: Not on file  Physical Activity: Not on file  Stress: Not on file  Social Connections: Not on file  Intimate Partner Violence: Not on file     ROS- All systems are reviewed and negative except as per the HPI above.  Physical Exam: Vitals:   07/04/21 1336  BP: 122/76  Pulse: (!) 58  Weight: 100.1 kg  Height: 5\' 4"  (1.626 m)    GEN- The patient is a well appearing obese female, alert and oriented x 3 today.   Head- normocephalic, atraumatic Eyes-  Sclera clear, conjunctiva pink Ears- hearing intact Oropharynx- clear Neck- supple  Lungs- Clear to ausculation bilaterally, normal work of breathing Heart- Regular rate and rhythm, no murmurs, rubs or gallops  GI- soft, NT, ND, + BS Extremities- no clubbing, cyanosis, or edema MS- no significant deformity or atrophy Skin- no rash or lesion Psych- euthymic mood, full affect Neuro- strength and sensation are intact  Wt Readings from Last 3 Encounters:  07/04/21 100.1 kg  07/01/21 99.8 kg  07/25/20 103 kg    EKG today demonstrates  SB Vent. rate 58 BPM PR interval 146 ms QRS duration 90 ms QT/QTcB 390/382 ms   Epic records are reviewed at length today  CHA2DS2-VASc Score = 1  The patient's score is based upon: CHF History: 0 HTN History: 0 Diabetes History: 0 Stroke History: 0 Vascular Disease History: 0 Age Score: 0 Gender Score: 1       ASSESSMENT AND PLAN: 1. Paroxysmal Atrial Fibrillation (ICD10:  I48.0) The patient's CHA2DS2-VASc score is 1, indicating a 0.6%  annual risk of stroke.   General education about afib provided and questions answered. We also discussed her stroke risk and the risks and benefits of anticoagulation. Check echocardiogram Start Toprol 12.5 mg daily Continue Lopressor 25 mg q 6 hours PRN for heart racing. No anticoagulation at this time with low CV score.   2. Obesity Body mass index is 37.87 kg/m. Lifestyle modification was discussed at length including regular exercise and weight reduction.  3. Chest pain Patient had chest pain with typical features during afib episodes. ACS workup negative at ED Will order cardiac CTA to evaluate.    Follow up in the AF clinic in one month. Will also have her establish care  with primary cardiologist.    Adline Peals PA-C [facility] [address] [place], [place] [address] [phone] 07/04/2021 4:17 PM

## 2021-07-05 ENCOUNTER — Telehealth (HOSPITAL_COMMUNITY): Payer: Self-pay | Admitting: *Deleted

## 2021-07-05 ENCOUNTER — Encounter (HOSPITAL_COMMUNITY): Payer: Self-pay

## 2021-07-05 NOTE — Telephone Encounter (Signed)
Reaching out to patient to offer assistance regarding upcoming cardiac imaging study; pt verbalizes understanding of appt date/time, parking situation and where to check in, pre-test NPO status; name and call back number provided for further questions should they arise  Larey Brick RN Navigator Cardiac Imaging [facility] [phone] office [phone] cell  Patient to take her daily metoprolol succinate two hours prior to her cardiac CT scan.  She is aware to arrive at 11:30am.

## 2021-07-06 ENCOUNTER — Ambulatory Visit (HOSPITAL_COMMUNITY)
Admission: RE | Admit: 2021-07-06 | Discharge: 2021-07-06 | Disposition: A | Discharge status: Home / Self Care | Payer: Managed Care, Other (non HMO) | Source: Ambulatory Visit | Attending: Physician Assistant | Admitting: Physician Assistant

## 2021-07-06 ENCOUNTER — Encounter (HOSPITAL_COMMUNITY): Payer: Self-pay | Admitting: *Deleted

## 2021-07-06 DIAGNOSIS — I480 Paroxysmal atrial fibrillation: Secondary | ICD-10-CM | POA: Diagnosis not present

## 2021-07-06 DIAGNOSIS — R079 Chest pain, unspecified: Secondary | ICD-10-CM | POA: Diagnosis not present

## 2021-07-06 MED ORDER — NITROGLYCERIN 0.4 MG SL SUBL
SUBLINGUAL_TABLET | SUBLINGUAL | Status: AC
  Filled 2021-07-06: qty 2

## 2021-07-06 MED ORDER — IOHEXOL 350 MG/ML SOLN
100.0000 mL | Freq: Once | INTRAVENOUS | Status: AC | PRN
  Administered 2021-07-06: 100 mL via INTRAVENOUS

## 2021-07-06 MED ORDER — NITROGLYCERIN 0.4 MG SL SUBL
0.8000 mg | SUBLINGUAL_TABLET | Freq: Once | SUBLINGUAL | Status: AC
  Administered 2021-07-06: 0.8 mg via SUBLINGUAL

## 2021-07-06 NOTE — Progress Notes (Signed)
CT scan completed. Tolerated wel.. D/C home ambulatory with husband. Awake and alert. In no distress

## 2021-07-14 ENCOUNTER — Ambulatory Visit (HOSPITAL_COMMUNITY)
Admission: RE | Admit: 2021-07-14 | Discharge: 2021-07-14 | Disposition: A | Discharge status: Home / Self Care | Payer: Managed Care, Other (non HMO) | Source: Ambulatory Visit | Attending: Physician Assistant | Admitting: Physician Assistant

## 2021-07-14 DIAGNOSIS — I480 Paroxysmal atrial fibrillation: Secondary | ICD-10-CM | POA: Diagnosis not present

## 2021-07-14 LAB — ECHOCARDIOGRAM COMPLETE
AR max vel: 2.62 cm2
AV Peak grad: 8 mmHg
Ao pk vel: 1.42 m/s
Area-P 1/2: 4.26 cm2
S' Lateral: 3.3 cm

## 2021-08-07 ENCOUNTER — Other Ambulatory Visit (HOSPITAL_COMMUNITY): Payer: Self-pay | Admitting: Physician Assistant

## 2021-08-07 ENCOUNTER — Ambulatory Visit (HOSPITAL_COMMUNITY)
Admission: RE | Admit: 2021-08-07 | Discharge: 2021-08-07 | Disposition: A | Discharge status: Home / Self Care | Payer: Managed Care, Other (non HMO) | Source: Ambulatory Visit | Attending: Physician Assistant

## 2021-08-07 ENCOUNTER — Ambulatory Visit (HOSPITAL_COMMUNITY)
Admission: RE | Admit: 2021-08-07 | Discharge: 2021-08-07 | Disposition: A | Discharge status: Home / Self Care | Payer: Managed Care, Other (non HMO) | Source: Ambulatory Visit | Attending: Physician Assistant | Admitting: Physician Assistant

## 2021-08-07 VITALS — BP 124/94 | HR 62 | Ht 64.0 in | Wt 221.4 lb

## 2021-08-07 DIAGNOSIS — E669 Obesity, unspecified: Secondary | ICD-10-CM | POA: Insufficient documentation

## 2021-08-07 DIAGNOSIS — Z79899 Other long term (current) drug therapy: Secondary | ICD-10-CM | POA: Diagnosis not present

## 2021-08-07 DIAGNOSIS — I480 Paroxysmal atrial fibrillation: Secondary | ICD-10-CM | POA: Diagnosis present

## 2021-08-07 DIAGNOSIS — R0602 Shortness of breath: Secondary | ICD-10-CM | POA: Diagnosis not present

## 2021-08-07 DIAGNOSIS — I4891 Unspecified atrial fibrillation: Secondary | ICD-10-CM

## 2021-08-07 DIAGNOSIS — Z6838 Body mass index (BMI) 38.0-38.9, adult: Secondary | ICD-10-CM | POA: Insufficient documentation

## 2021-08-07 DIAGNOSIS — R079 Chest pain, unspecified: Secondary | ICD-10-CM | POA: Insufficient documentation

## 2021-08-07 NOTE — Progress Notes (Signed)
Primary Care Physician: Mechele Claude, MD Primary Cardiologist: none Primary Electrophysiologist: none Referring Physician: [facility]   Rhonda Anthony is a 51 y.o. female with a history of atrial fibrillation who presents for follow up in the [facility].  The patient was initially diagnosed with atrial fibrillation 07/01/21 after presenting to the ED with symptoms of chest pain, tachypalpitations, and SOB. EMS was called and she was found to be in afib with RVR heart rates ~150 bpm. She converted en route to the ED. EMS run sheet reviewed. Patient was prescribed metoprolol for rate control. She returned to the ED again on 07/03/21 with chest pain and heart racing which again resolved prior to arrival at the ED. She had not started metoprolol yet. She describes her chest pain as a "heaviness" in her chest which radiated to her jaw and left arm. Patient has a CHADS2VASC score of 1.  On follow up today, patient had an echocardiogram which showed normal EF, no valvular issues and a cardiac CT which showed CAC score of 0. She does continue to have brief episodes of SOB and has had difficulty capturing episodes on her Apple Watch.   Today, she denies symptoms of chest pain, orthopnea, PND, lower extremity edema, dizziness, presyncope, syncope, snoring, daytime somnolence, bleeding, or neurologic sequela. The patient is tolerating medications without difficulties and is otherwise without complaint today.    Atrial Fibrillation Risk Factors:  she does not have symptoms or diagnosis of sleep apnea. she does not have a history of rheumatic fever. she does have a history of alcohol use. The patient does not have a history of early familial atrial fibrillation or other arrhythmias.  she has a BMI of Body mass index is 38 kg/m.Marland Kitchen Filed Weights   08/07/21 1342  Weight: 100.4 kg     Family History  Problem Relation Age of Onset   Anuerysm Mother    Diabetes  Unknown    Colon cancer Neg Hx    Esophageal cancer Neg Hx    Stomach cancer Neg Hx      Atrial Fibrillation Management history:  Previous antiarrhythmic drugs: none Previous cardioversions: none Previous ablations: none CHADS2VASC score: 1 Anticoagulation history: none   Past Medical History:  Diagnosis Date   Abdominal pain, left lower quadrant 07/01/2007   Qualifier: Diagnosis of  By: Koleen Distance CMA (AAMA), Leisha     Chest pain 06/26/2012   GERD (gastroesophageal reflux disease)    Internal hemorrhoids without mention of complication 2009   Colonoscopy    Stomach ulcer    x 2   Stress at work 06/26/2012   Past Surgical History:  Procedure Laterality Date   ABDOMINAL HYSTERECTOMY     APPENDECTOMY     COLONOSCOPY  2014   Normal    ECTOPIC PREGNANCY SURGERY     ESOPHAGOGASTRODUODENOSCOPY  2014   Two Ulcers    EYE SURGERY     KNEE SURGERY Left    TUBAL LIGATION      Current Outpatient Medications  Medication Sig Dispense Refill   Cholecalciferol (VITAMIN D3) 25 MCG (1000 UT) CAPS Take 1 capsule by mouth daily.     loratadine (CLARITIN) 10 MG tablet Take 10 mg by mouth daily.     metoprolol succinate (TOPROL-XL) 25 MG 24 hr tablet Take 0.5 tablets (12.5 mg total) by mouth daily. 15 tablet 3   metoprolol tartrate (LOPRESSOR) 25 MG tablet Take 1 tablet (25 mg total) by mouth every 8 (  eight) hours as needed (racing heart beat). 30 tablet 0   No current facility-administered medications for this encounter.    Allergies  Allergen Reactions   Biaxin [Clarithromycin]     unknown   Demerol [Meperidine]     unknown   Nsaids    Phentermine Palpitations    Social History   Socioeconomic History   Marital status: Married    Spouse name: Not on file   Number of children: Not on file   Years of education: Not on file   Highest education level: Not on file  Occupational History   Not on file  Tobacco Use   Smoking status: Former   Smokeless tobacco: Never   Tobacco  comments:    Former smoker 07/04/21  Substance and Sexual Activity   Alcohol use: Yes    Alcohol/week: 3.0 standard drinks of alcohol    Types: 3 Standard drinks or equivalent per week    Comment: Truly drinks 3 once a week 07/04/21   Drug use: No   Sexual activity: Not on file  Other Topics Concern   Not on file  Social History Narrative   Not on file   Social Determinants of Health   Financial Resource Strain: Not on file  Food Insecurity: Not on file  Transportation Needs: Not on file  Physical Activity: Not on file  Stress: Not on file  Social Connections: Not on file  Intimate Partner Violence: Not on file     ROS- All systems are reviewed and negative except as per the HPI above.  Physical Exam: Vitals:   08/07/21 1342  BP: (!) 124/94  Pulse: 62  Weight: 100.4 kg  Height: 5\' 4"  (1.626 m)     GEN- The patient is a well appearing obese female, alert and oriented x 3 today.   HEENT-head normocephalic, atraumatic, sclera clear, conjunctiva pink, hearing intact, trachea midline. Lungs- Clear to ausculation bilaterally, normal work of breathing Heart- Regular rate and rhythm, no murmurs, rubs or gallops  GI- soft, NT, ND, + BS Extremities- no clubbing, cyanosis, or edema MS- no significant deformity or atrophy Skin- no rash or lesion Psych- euthymic mood, full affect Neuro- strength and sensation are intact   Wt Readings from Last 3 Encounters:  08/07/21 100.4 kg  07/04/21 100.1 kg  07/01/21 99.8 kg    EKG today demonstrates  SR Vent. rate 62 BPM PR interval 148 ms QRS duration 88 ms QT/QTcB 404/410 ms  Echo 07/14/21  1. Left ventricular ejection fraction, by estimation, is 55 to 60%. The  left ventricle has normal function. The left ventricle has no regional  wall motion abnormalities. Left ventricular diastolic parameters were  normal.   2. Right ventricular systolic function is normal. The right ventricular  size is normal. There is normal  pulmonary artery systolic pressure.   3. The mitral valve is grossly normal. No evidence of mitral valve  regurgitation. No evidence of mitral stenosis.   4. The aortic valve is tricuspid. Aortic valve regurgitation is not  visualized. No aortic stenosis is present.   5. The inferior vena cava is normal in size with greater than 50%  respiratory variability, suggesting right atrial pressure of 3 mmHg.   Comparison(s): No prior Echocardiogram.   Conclusion(s)/Recommendation(s): Normal biventricular function without evidence of hemodynamically significant valvular heart disease.    Epic records are reviewed at length today  CHA2DS2-VASc Score = 1  The patient's score is based upon: CHF History: 0 HTN History: 0 Diabetes  History: 0 Stroke History: 0 Vascular Disease History: 0 Age Score: 0 Gender Score: 1       ASSESSMENT AND PLAN: 1. Paroxysmal Atrial Fibrillation (ICD10:  I48.0) The patient's CHA2DS2-VASc score is 1, indicating a 0.6% annual risk of stroke.   Patient in SR today, but patient still having brief symptoms of SOB.  Apple Watch strips have been inconclusive.  Will have her wear a 2 week Zio monitor to evaluate arrhythmia burden. If she is having frequent afib, may consider AAD (Multaq, flecainide) or ablation.  Continue Toprol 12.5 mg daily Continue Lopressor 25 mg q 6 hours PRN for heart racing. No anticoagulation at this time with low CV score.   2. Obesity Body mass index is 38 kg/m. Lifestyle modification was discussed and encouraged including regular physical activity and weight reduction.  3. Chest pain Resolved. CAC score 0.   Follow up in the AF clinic in 6 months, sooner pending monitor results.    Jorja Loa PA-C [facility] [address] [place], [place] [address] [phone] 08/07/2021 2:07 PM

## 2021-08-29 ENCOUNTER — Encounter (HOSPITAL_COMMUNITY): Payer: Self-pay | Admitting: *Deleted

## 2021-08-29 ENCOUNTER — Ambulatory Visit: Payer: Managed Care, Other (non HMO) | Admitting: Cardiovascular Disease

## 2021-10-03 ENCOUNTER — Other Ambulatory Visit (HOSPITAL_COMMUNITY): Payer: Self-pay | Admitting: *Deleted

## 2021-10-03 MED ORDER — METOPROLOL SUCCINATE ER 25 MG PO TB24: 12.5000 mg | ORAL_TABLET | Freq: Every day | ORAL | 6 refills | Status: DC

## 2021-10-06 ENCOUNTER — Encounter: Payer: Self-pay | Admitting: Nurse Practitioner

## 2021-10-06 ENCOUNTER — Ambulatory Visit: Payer: Managed Care, Other (non HMO) | Admitting: Nurse Practitioner

## 2021-10-06 VITALS — HR 67 | Temp 98.6°F | Ht 68.0 in | Wt 228.0 lb

## 2021-10-06 DIAGNOSIS — W57XXXA Bitten or stung by nonvenomous insect and other nonvenomous arthropods, initial encounter: Secondary | ICD-10-CM | POA: Diagnosis not present

## 2021-10-06 DIAGNOSIS — S40861A Insect bite (nonvenomous) of right upper arm, initial encounter: Secondary | ICD-10-CM | POA: Diagnosis not present

## 2021-10-06 MED ORDER — HYDROCORTISONE 1 % EX LOTN: 1.0000 | TOPICAL_LOTION | Freq: Two times a day (BID) | CUTANEOUS | 0 refills | Status: DC

## 2021-10-06 MED ORDER — CEPHALEXIN 500 MG PO CAPS: 500.0000 mg | ORAL_CAPSULE | Freq: Two times a day (BID) | ORAL | 0 refills | Status: DC

## 2021-10-06 NOTE — Progress Notes (Signed)
Acute Office Visit  Subjective:     Patient ID: Rhonda Anthony, female    DOB: Nov 27, 1970, 51 y.o.   MRN: [id number]  Chief Complaint  Patient presents with   Insect Bite    Rash This is a new problem. Episode onset: In the past 3 to 4 days. The problem is unchanged. The affected locations include the right arm. The rash is characterized by itchiness and redness. She was exposed to an insect bite/sting. Pertinent negatives include no anorexia, congestion, cough, facial edema, fatigue, fever or joint pain. Past treatments include nothing.    Review of Systems  Constitutional: Negative.  Negative for chills, fatigue, fever and malaise/fatigue.  HENT: Negative.  Negative for congestion.   Eyes: Negative.   Respiratory:  Negative for cough.   Cardiovascular: Negative.   Gastrointestinal:  Negative for anorexia.  Musculoskeletal:  Negative for joint pain.  Skin:  Positive for rash.  All other systems reviewed and are negative.       Objective:    Pulse 67   Temp 98.6 F (37 C)   Ht 5\' 8"  (1.727 m)   Wt 228 lb (103.4 kg)   SpO2 97%   BMI 34.67 kg/m  BP Readings from Last 3 Encounters:  08/07/21 (!) 124/94  07/06/21 116/60  07/04/21 122/76   Wt Readings from Last 3 Encounters:  10/06/21 228 lb (103.4 kg)  08/07/21 221 lb 6.4 oz (100.4 kg)  07/04/21 220 lb 9.6 oz (100.1 kg)      Physical Exam Vitals and nursing note reviewed.  Constitutional:      Appearance: Normal appearance.  HENT:     Head: Normocephalic and atraumatic.     Right Ear: External ear normal.     Left Ear: External ear normal.     Nose: Nose normal.     Mouth/Throat:     Mouth: Mucous membranes are moist.     Pharynx: Oropharynx is clear.  Eyes:     Conjunctiva/sclera: Conjunctivae normal.  Cardiovascular:     Rate and Rhythm: Normal rate and regular rhythm.     Pulses: Normal pulses.     Heart sounds: Normal heart sounds.  Pulmonary:     Effort: Pulmonary effort is normal.      Breath sounds: Normal breath sounds.  Abdominal:     General: Bowel sounds are normal.  Skin:    General: Skin is warm.     Findings: Erythema and rash present.  Neurological:     Mental Status: She is alert and oriented to person, place, and time.     No results found for any visits on 10/06/21.      Assessment & Plan:  For insect bite symptoms present in the past 3 to 4 days.  Patient was bitten in the pool.  Right upper arm is red, warm to touch with mild tenderness.  1% hydrocortisone topical cream, cool compress, avoid scratching with nails, Keflex 500 mg tablet by mouth twice daily for skin infection.  Follow-up with worsening hours of symptoms. Problem List Items Addressed This Visit   None Visit Diagnoses     Insect bite of right upper arm, initial encounter    -  Primary   Relevant Medications   cephALEXin (KEFLEX) 500 MG capsule   hydrocortisone 1 % lotion       Meds ordered this encounter  Medications   cephALEXin (KEFLEX) 500 MG capsule    Sig: Take 1 capsule (500 mg total) by mouth  2 (two) times daily.    Dispense:  14 capsule    Refill:  0    Order Specific Question:   Supervising Provider    Answer:   Standley Brooking   hydrocortisone 1 % lotion    Sig: Apply 1 Application topically 2 (two) times daily.    Dispense:  118 mL    Refill:  0    Order Specific Question:   Supervising Provider    Answer:   Mechele Claude [[id number]]    Return if symptoms worsen or fail to improve, for Insect bite.  Daryll Drown, NP

## 2021-10-06 NOTE — Patient Instructions (Signed)
Insect Bite, Adult An insect bite can make your skin red, itchy, and swollen. Some insects can spread disease to people with a bite. However, most insect bites do not lead to disease, and most are not serious. What are the causes? Insects may bite for many reasons, including: Hunger. To defend themselves. Insects that bite include: Spiders. Mosquitoes. Flies. Ticks and fleas. Ants. Kissing bugs. Chiggers. What are the signs or symptoms? Symptoms often last for 2-4 days. However, itching can last up to 10 days. Symptoms include: Itching or pain in the bite area. Redness and swelling in the bite area. An open wound. In rare cases, a person may have a very bad allergic reaction (anaphylactic reaction) to a bite. Symptoms of an anaphylactic reaction may include: Feeling warm in the face (flushed). Your face may turn red. Itchy, red, swollen areas of skin (hives). Swelling of the eyes, lips, face, mouth, tongue, or throat. Trouble with breathing, talking, or swallowing. High-pitched whistling sounds, most often when breathing out (wheezing). Feeling dizzy or light-headed. Fainting. Pain or cramps in your belly (abdomen). Vomiting. Watery poop (diarrhea). How is this treated? Most insect bites are not serious. Symptoms often go away on their own. When treatment is advised, it may include: Putting ice on the bite area. Putting a cream or lotion, like calamine lotion, on the bite area. This helps with itching. Using medicines called antihistamines. You may also need: A tetanus shot if you are not up to date. An antibiotic cream or medicine. This treatment is needed if the bite area gets infected. Follow these instructions at home: Bite area care  Do not scratch the bite area. It may help to cover the bite area with a bandage or close-fitting clothing. Keep the bite area clean and dry. Check the bite area every day for signs of infection. Check for: More redness, swelling, or  pain. Fluid or blood. Warmth. Pus or a bad smell. Wash your hands often. Managing pain, itching, and swelling  You may put any of these on the bite area as told by your doctor: A paste made of baking soda and water. Cortisone cream. Calamine lotion. If told, put ice on the bite area. To do this: Put ice in a plastic bag. Place a towel between your skin and the bag. Leave the ice on for 20 minutes, 2-3 times a day. If your skin turns bright red, take off the ice right away to prevent skin damage. The risk of skin damage is higher if you cannot feel pain, heat, or cold. General instructions Apply or take over-the-counter and prescription medicines only as told by your doctor. If you were prescribed antibiotics, take or apply them as told by your doctor. Do not stop using them even if you start to feel better. How is this prevented? To help you have a lower risk of insect bites: When you are outside, wear clothes that cover your arms and legs. Use insect repellent. The best insect repellents contain one of these: DEET. Picaridin. Oil of lemon eucalyptus (OLE). IR3535. Consider spraying your clothing with a pesticide called permethrin. Permethrin helps prevent insect bites. It works for several weeks and for up to 5-6 clothing washes. Do not apply permethrin directly to the skin. If your home windows do not have screens, think about putting some in. If you will be sleeping in an area where there are mosquitoes, consider covering your sleeping area with a mosquito net. Contact a doctor if: You have redness, swelling, or pain   in the bite area. You have fluid or blood coming from the bite area. The bite area feels warm to the touch. You have pus or a bad smell coming from the bite area. You have a fever. Get help right away if: You have joint pain. You have a rash. You feel weak or more tired than you normally do. You have neck pain or a headache. You have signs of an anaphylactic  reaction. Signs may include: Swelling of your eyes, lips, face, mouth, tongue, or throat. Feeling warm in the face. Itchy, red, swollen areas of skin. Trouble with breathing, talking, or swallowing. Wheezing. Feeling dizzy or light-headed. Fainting. Pain or cramps in your belly. Vomiting or watery poop. These symptoms may be an emergency. Get help right away. Call 911. Do not wait to see if symptoms will go away. Do not drive yourself to the hospital. Summary An insect bite can make your skin red, itchy, and swollen. Treatment is usually not needed. Symptoms often go away on their own. Do not scratch the bite area. Keep it clean and dry. Use insect repellent to help prevent insect bites. Contact a doctor if you have signs of infection. This information is not intended to replace advice given to you by your health care provider. Make sure you discuss any questions you have with your health care provider. Document Revised: 04/11/2021 Document Reviewed: 04/11/2021 Elsevier Patient Education  2023 [employer]  

## 2021-10-31 ENCOUNTER — Other Ambulatory Visit (HOSPITAL_COMMUNITY): Payer: Self-pay | Admitting: *Deleted

## 2021-10-31 MED ORDER — METOPROLOL SUCCINATE ER 25 MG PO TB24: 12.5000 mg | ORAL_TABLET | Freq: Every day | ORAL | 6 refills | Status: DC

## 2022-02-07 ENCOUNTER — Ambulatory Visit (HOSPITAL_COMMUNITY)
Admission: RE | Admit: 2022-02-07 | Discharge: 2022-02-07 | Disposition: A | Discharge status: Home / Self Care | Payer: Managed Care, Other (non HMO) | Source: Ambulatory Visit | Attending: Physician Assistant | Admitting: Physician Assistant

## 2022-02-07 ENCOUNTER — Encounter (HOSPITAL_COMMUNITY): Payer: Self-pay | Admitting: Physician Assistant

## 2022-02-07 VITALS — BP 126/80 | HR 60 | Ht 68.0 in | Wt 231.4 lb

## 2022-02-07 DIAGNOSIS — Z87891 Personal history of nicotine dependence: Secondary | ICD-10-CM | POA: Diagnosis not present

## 2022-02-07 DIAGNOSIS — Z6835 Body mass index (BMI) 35.0-35.9, adult: Secondary | ICD-10-CM | POA: Insufficient documentation

## 2022-02-07 DIAGNOSIS — Z79899 Other long term (current) drug therapy: Secondary | ICD-10-CM | POA: Insufficient documentation

## 2022-02-07 DIAGNOSIS — E669 Obesity, unspecified: Secondary | ICD-10-CM | POA: Diagnosis not present

## 2022-02-07 DIAGNOSIS — I480 Paroxysmal atrial fibrillation: Secondary | ICD-10-CM | POA: Diagnosis not present

## 2022-02-07 NOTE — Progress Notes (Signed)
Primary Care Physician: Mechele Claude, MD Primary Cardiologist: none Primary Electrophysiologist: none Referring Physician: [facility]   Rhonda Anthony is a 52 y.o. female with a history of atrial fibrillation who presents for follow up in the [facility].  The patient was initially diagnosed with atrial fibrillation 07/01/21 after presenting to the ED with symptoms of chest pain, tachypalpitations, and SOB. EMS was called and she was found to be in afib with RVR heart rates ~150 bpm. She converted en route to the ED. EMS run sheet reviewed. Patient was prescribed metoprolol for rate control. She returned to the ED again on 07/03/21 with chest pain and heart racing which again resolved prior to arrival at the ED. She had not started metoprolol yet. She describes her chest pain as a "heaviness" in her chest which radiated to her jaw and left arm. Patient has a CHADS2VASC score of 1.  On follow up today, patient reports that she has done very well since her last visit. She had two very brief palpitations only lasting a second or two.   Today, she denies symptoms of chest pain, orthopnea, PND, lower extremity edema, dizziness, presyncope, syncope, snoring, daytime somnolence, bleeding, or neurologic sequela. The patient is tolerating medications without difficulties and is otherwise without complaint today.    Atrial Fibrillation Risk Factors:  she does not have symptoms or diagnosis of sleep apnea. she does not have a history of rheumatic fever. she does have a history of alcohol use. The patient does not have a history of early familial atrial fibrillation or other arrhythmias.  she has a BMI of Body mass index is 35.18 kg/m.Marland Kitchen Filed Weights   02/07/22 1130  Weight: 105 kg    Family History  Problem Relation Age of Onset   Anuerysm Mother    Diabetes Unknown    Colon cancer Neg Hx    Esophageal cancer Neg Hx    Stomach cancer Neg Hx      Atrial  Fibrillation Management history:  Previous antiarrhythmic drugs: none Previous cardioversions: none Previous ablations: none CHADS2VASC score: 1 Anticoagulation history: none   Past Medical History:  Diagnosis Date   Abdominal pain, left lower quadrant 07/01/2007   Qualifier: Diagnosis of  By: Koleen Distance CMA (AAMA), Leisha     Chest pain 06/26/2012   GERD (gastroesophageal reflux disease)    Internal hemorrhoids without mention of complication 2009   Colonoscopy    Stomach ulcer    x 2   Stress at work 06/26/2012   Past Surgical History:  Procedure Laterality Date   ABDOMINAL HYSTERECTOMY     APPENDECTOMY     COLONOSCOPY  2014   Normal    ECTOPIC PREGNANCY SURGERY     ESOPHAGOGASTRODUODENOSCOPY  2014   Two Ulcers    EYE SURGERY     KNEE SURGERY Left    TUBAL LIGATION      Current Outpatient Medications  Medication Sig Dispense Refill   Cholecalciferol (VITAMIN D3) 25 MCG (1000 UT) CAPS Take 1 capsule by mouth daily.     loratadine (CLARITIN) 10 MG tablet Take 10 mg by mouth daily.     magnesium (MAGTAB) 84 MG ( ) TBCR SR tablet Take 84 mg by mouth.     metoprolol succinate (TOPROL-XL) 25 MG 24 hr tablet Take 0.5 tablets (12.5 mg total) by mouth daily. 15 tablet 6   metoprolol tartrate (LOPRESSOR) 25 MG tablet Take 1 tablet (25 mg total) by mouth every 8 (  eight) hours as needed (racing heart beat). 30 tablet 0   zinc gluconate 50 MG tablet Take 50 mg by mouth daily.     No current facility-administered medications for this encounter.    Allergies  Allergen Reactions   Biaxin [Clarithromycin]     unknown   Demerol [Meperidine]     unknown   Nsaids    Phentermine Palpitations    Social History   Socioeconomic History   Marital status: Married    Spouse name: Not on file   Number of children: Not on file   Years of education: Not on file   Highest education level: Not on file  Occupational History   Not on file  Tobacco Use   Smoking status: Former    Smokeless tobacco: Never   Tobacco comments:    Former smoker 07/04/21  Substance and Sexual Activity   Alcohol use: Yes    Alcohol/week: 3.0 standard drinks of alcohol    Types: 3 Standard drinks or equivalent per week    Comment: 3 once a week 07/04/21   Drug use: No   Sexual activity: Not on file  Other Topics Concern   Not on file  Social History Narrative   Not on file   Social Determinants of Health   Financial Resource Strain: Not on file  Food Insecurity: Not on file  Transportation Needs: Not on file  Physical Activity: Not on file  Stress: Not on file  Social Connections: Not on file  Intimate Partner Violence: Not on file     ROS- All systems are reviewed and negative except as per the HPI above.  Physical Exam: Vitals:   02/07/22 1130  BP: 126/80  Pulse: 60  Weight: 105 kg  Height: 5\' 8"  (1.727 m)    GEN- The patient is a well appearing obese female, alert and oriented x 3 today.   HEENT-head normocephalic, atraumatic, sclera clear, conjunctiva pink, hearing intact, trachea midline. Lungs- Clear to ausculation bilaterally, normal work of breathing Heart- Regular rate and rhythm, no murmurs, rubs or gallops  GI- soft, NT, ND, + BS Extremities- no clubbing, cyanosis, or edema MS- no significant deformity or atrophy Skin- no rash or lesion Psych- euthymic mood, full affect Neuro- strength and sensation are intact   Wt Readings from Last 3 Encounters:  02/07/22 105 kg  10/06/21 103.4 kg  08/07/21 100.4 kg    EKG today demonstrates  SR Vent. rate 60 BPM PR interval 136 ms QRS duration 88 ms QT/QTcB 400/400 ms  Echo 07/14/21  1. Left ventricular ejection fraction, by estimation, is 55 to 60%. The  left ventricle has normal function. The left ventricle has no regional  wall motion abnormalities. Left ventricular diastolic parameters were  normal.   2. Right ventricular systolic function is normal. The right ventricular  size is normal. There is  normal pulmonary artery systolic pressure.   3. The mitral valve is grossly normal. No evidence of mitral valve  regurgitation. No evidence of mitral stenosis.   4. The aortic valve is tricuspid. Aortic valve regurgitation is not  visualized. No aortic stenosis is present.   5. The inferior vena cava is normal in size with greater than 50%  respiratory variability, suggesting right atrial pressure of 3 mmHg.   Comparison(s): No prior Echocardiogram.   Conclusion(s)/Recommendation(s): Normal biventricular function without evidence of hemodynamically significant valvular heart disease.    Epic records are reviewed at length today  CHA2DS2-VASc Score = 1  The patient's  score is based upon: CHF History: 0 HTN History: 0 Diabetes History: 0 Stroke History: 0 Vascular Disease History: 0 Age Score: 0 Gender Score: 1       ASSESSMENT AND PLAN: 1. Paroxysmal Atrial Fibrillation (ICD10:  I48.0) The patient's CHA2DS2-VASc score is 1, indicating a 0.6% annual risk of stroke.   Patient appears to be maintaining SR. Patient would like a trial off of BB.  Will discontinue Toprol. Keep Lopressor 25 mg q 6 hours PRN for heart racing. No anticoagulation at this time with low CV score.  Apple Watch for home monitoring.   2. Obesity Body mass index is 35.18 kg/m. Lifestyle modification was discussed and encouraged including regular physical activity and weight reduction.   Follow up in the AF clinic as needed.    [facility] [address] [place], [place] [address] [phone] 02/07/2022 11:51 AM

## 2022-05-01 ENCOUNTER — Other Ambulatory Visit (HOSPITAL_COMMUNITY): Payer: Self-pay

## 2022-05-02 ENCOUNTER — Other Ambulatory Visit (HOSPITAL_COMMUNITY): Payer: Self-pay | Admitting: Physician Assistant

## 2022-05-18 MED ORDER — METOPROLOL TARTRATE 25 MG PO TABS: 25.0000 mg | ORAL_TABLET | Freq: Three times a day (TID) | ORAL | 0 refills | Status: AC | PRN

## 2022-07-05 LAB — HM COLONOSCOPY

## 2023-04-02 IMAGING — MG DIGITAL SCREENING BILAT W/ CAD
5 series · 5 of 5 positions shown · non-contrast
Comparison: Previous exam(s).

CLINICAL DATA: Screening.

EXAM:
DIGITAL SCREENING BILATERAL MAMMOGRAM WITH CAD
TECHNIQUE: Bilateral screening digital craniocaudal and mediolateral oblique
mammograms were obtained. The images were evaluated with
computer-aided detection.

[L CC]
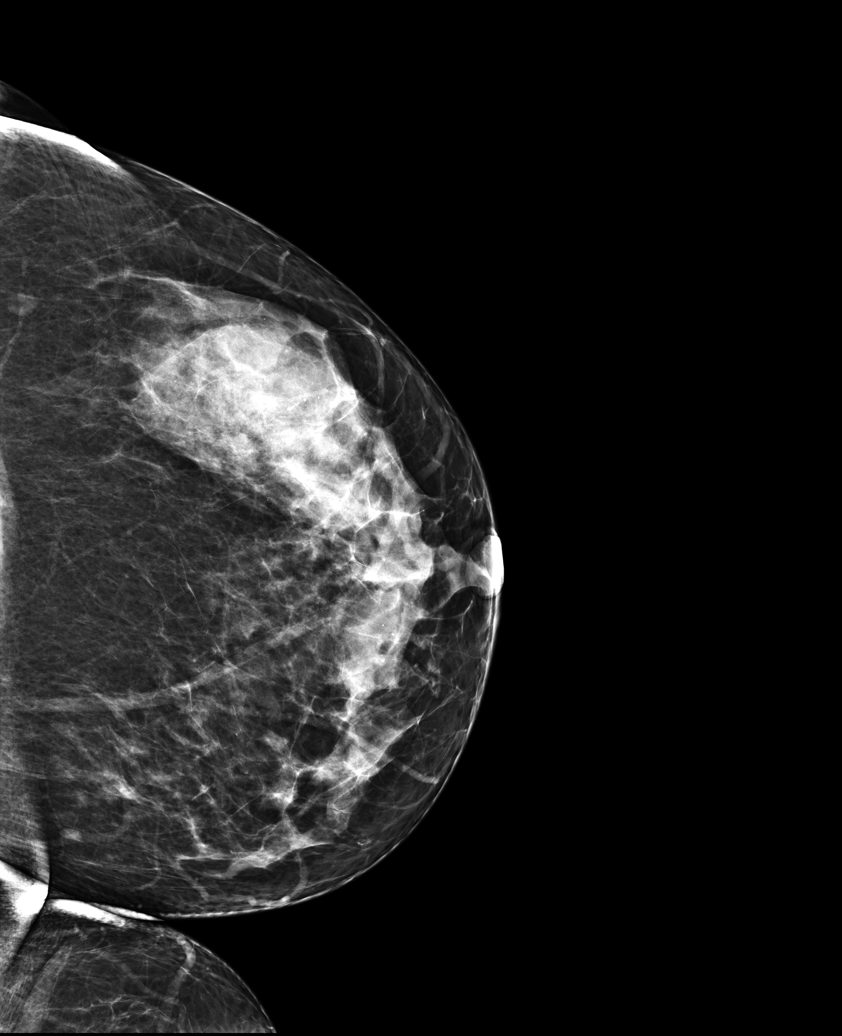

[R MLO]
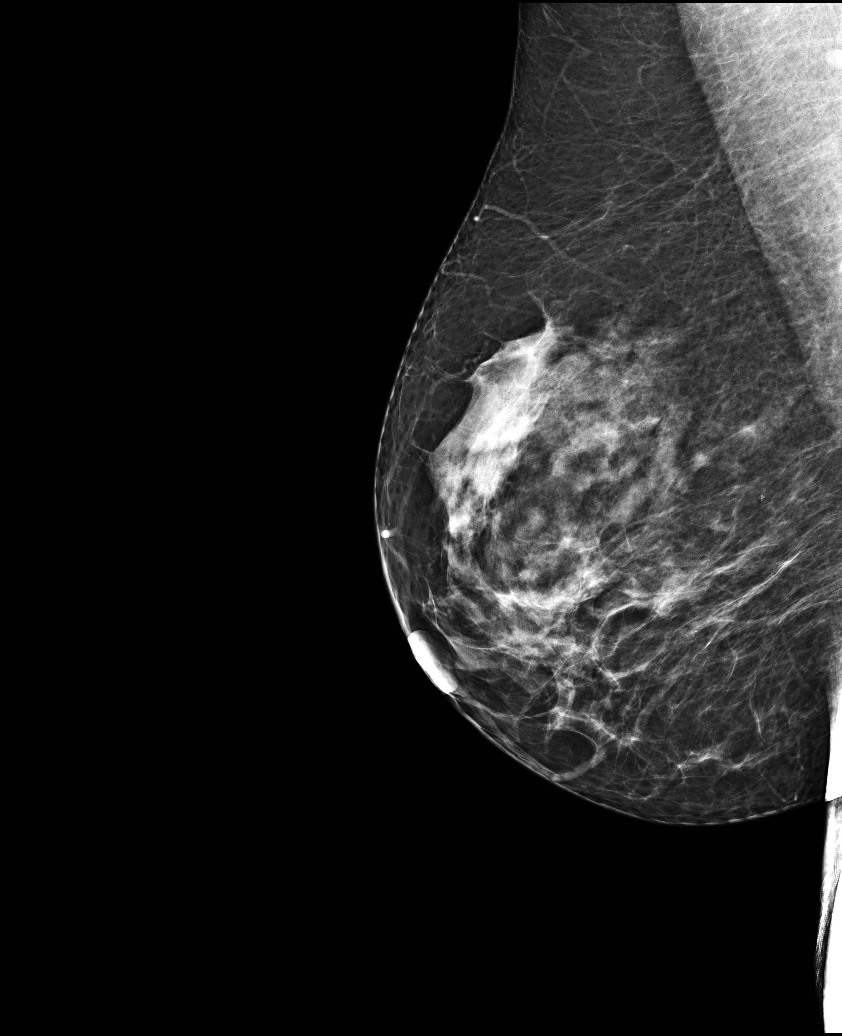

[L MLO (1 of 2)]
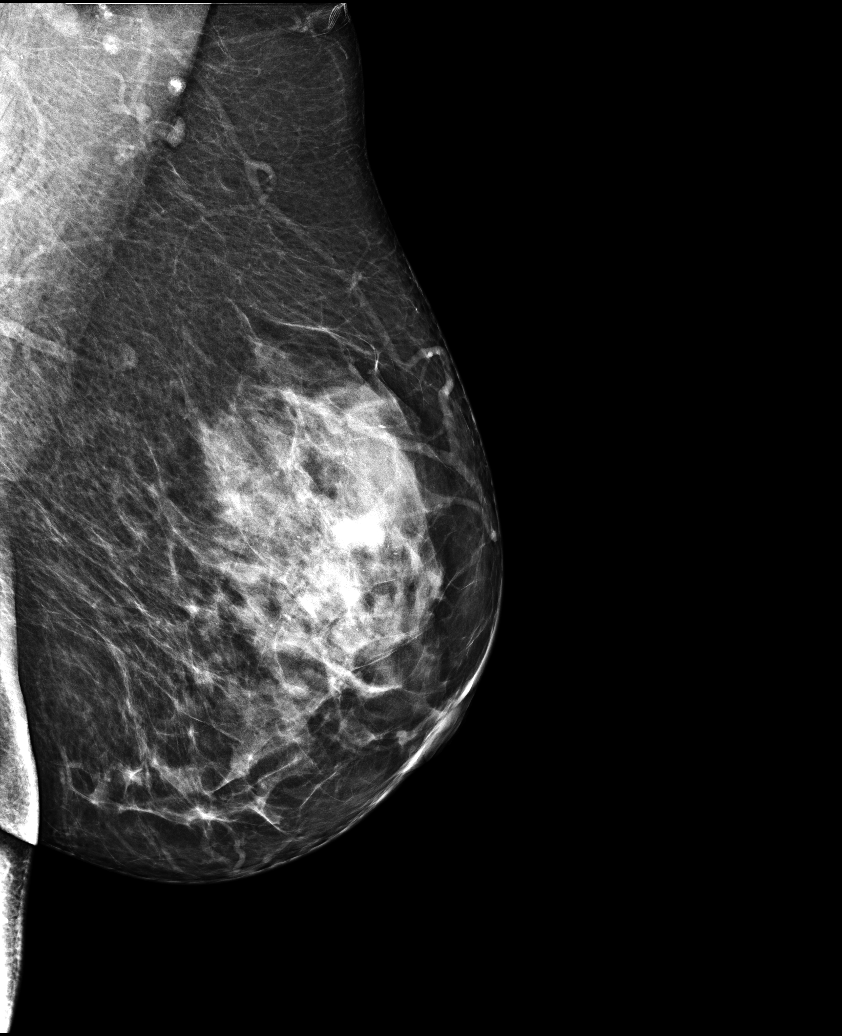

[R CC]
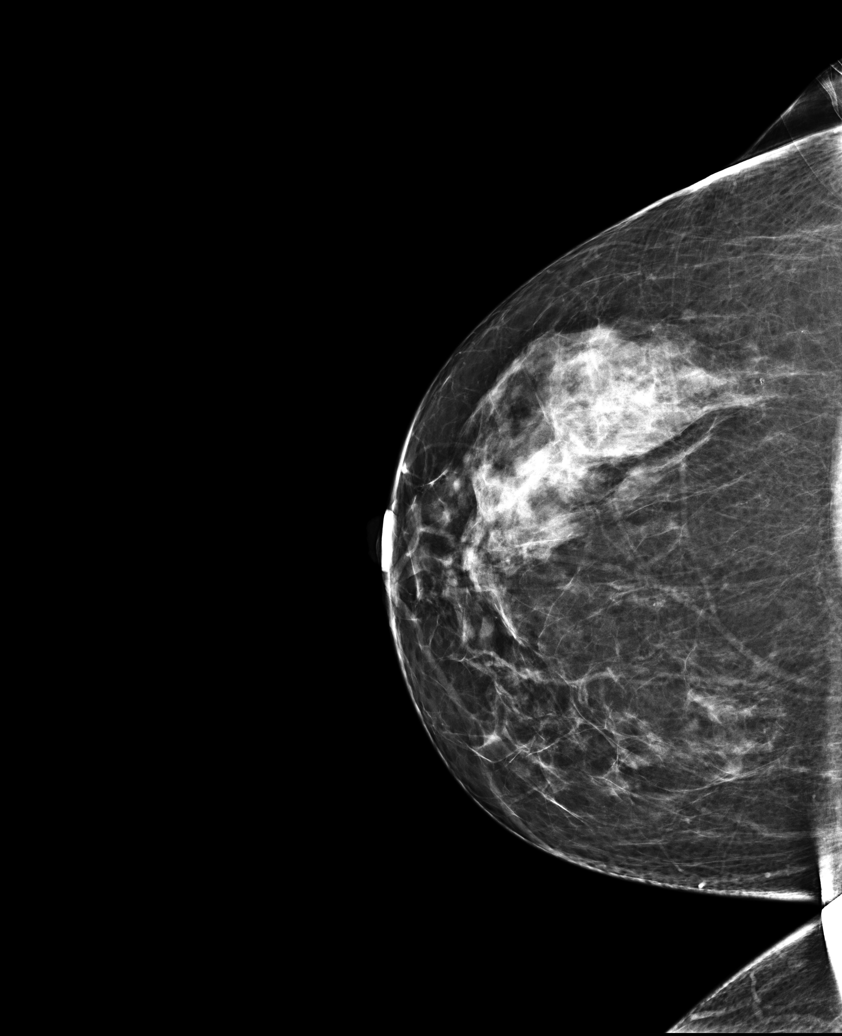

[L MLO (2 of 2)]
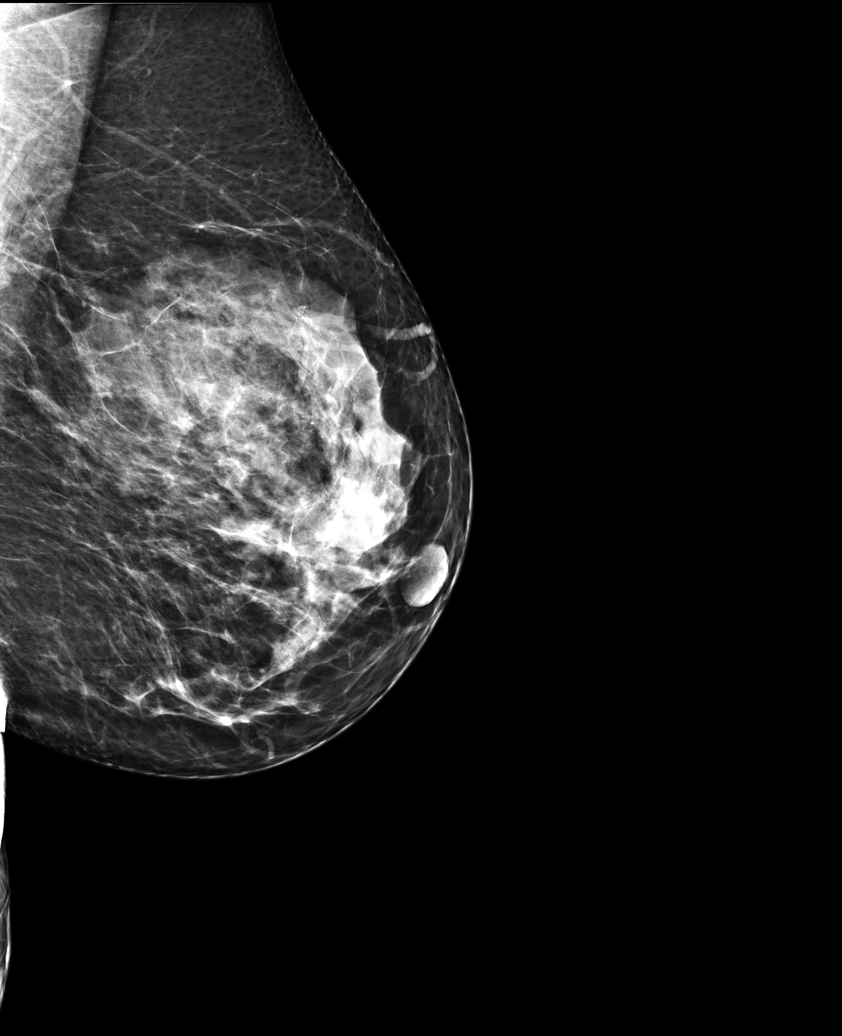

[5 of 5 positions shown; findings below may reference images not displayed]

ACR Breast Density Category c: The breast tissue is heterogeneously
dense, which may obscure small masses.
FINDINGS: There are no findings suspicious for malignancy.
IMPRESSION: No mammographic evidence of malignancy. A result letter of this
screening mammogram will be mailed directly to the patient.

RECOMMENDATION:
Screening mammogram in one year. (Code:[id number]-B-ZPA)

BI-RADS CATEGORY  1: Negative.

## 2023-08-06 ENCOUNTER — Emergency Department (HOSPITAL_COMMUNITY)
Admission: EM | Admit: 2023-08-06 | Discharge: 2023-08-06 | Disposition: A | Discharge status: Home / Self Care | Attending: Emergency Medicine | Admitting: Emergency Medicine

## 2023-08-06 ENCOUNTER — Emergency Department (HOSPITAL_COMMUNITY)

## 2023-08-06 ENCOUNTER — Encounter (HOSPITAL_COMMUNITY): Payer: Self-pay

## 2023-08-06 ENCOUNTER — Other Ambulatory Visit: Payer: Self-pay

## 2023-08-06 DIAGNOSIS — M546 Pain in thoracic spine: Secondary | ICD-10-CM | POA: Diagnosis not present

## 2023-08-06 DIAGNOSIS — R110 Nausea: Secondary | ICD-10-CM | POA: Insufficient documentation

## 2023-08-06 DIAGNOSIS — R079 Chest pain, unspecified: Secondary | ICD-10-CM | POA: Diagnosis present

## 2023-08-06 DIAGNOSIS — R0602 Shortness of breath: Secondary | ICD-10-CM | POA: Diagnosis not present

## 2023-08-06 LAB — BASIC METABOLIC PANEL WITH GFR
Anion gap: 12 (ref 5–15)
BUN: 19 mg/dL (ref 6–20)
CO2: 25 mmol/L (ref 22–32)
Calcium: 9.5 mg/dL (ref 8.9–10.3)
Chloride: 101 mmol/L (ref 98–111)
Creatinine, Ser: 0.98 mg/dL (ref 0.44–1.00)
GFR, Estimated: >60 mL/min (ref >60)
Glucose, Bld: 91 mg/dL (ref 70–99)
Potassium: 4.2 mmol/L (ref 3.5–5.1)
Sodium: 138 mmol/L (ref 135–145)

## 2023-08-06 LAB — CBC
HCT: 44.4 % (ref 36.0–46.0)
Hemoglobin: 14.4 g/dL (ref 12.0–15.0)
MCH: 29.2 pg (ref 26.0–34.0)
MCHC: 32.4 g/dL (ref 30.0–36.0)
MCV: 90.1 fL (ref 80.0–100.0)
Platelets: 326 K/uL (ref 150–400)
RBC: 4.93 MIL/uL (ref 3.87–5.11)
RDW: 13.1 % (ref 11.5–15.5)
WBC: 6.5 K/uL (ref 4.0–10.5)
nRBC: 0 % (ref 0.0–0.2)

## 2023-08-06 LAB — TROPONIN I (HIGH SENSITIVITY)
Troponin I (High Sensitivity): 4 ng/L (ref <18)
Troponin I (High Sensitivity): 6 ng/L (ref <18)

## 2023-08-06 MED ORDER — IOHEXOL 350 MG/ML SOLN
75.0000 mL | Freq: Once | INTRAVENOUS | Status: AC | PRN
  Administered 2023-08-06: 75 mL via INTRAVENOUS

## 2023-08-06 NOTE — ED Provider Triage Note (Signed)
 Emergency Medicine Provider Triage Evaluation Note  Rhonda Anthony , a 53 y.o. female  was evaluated in triage.  Pt complains of pain in her left upper back radiating to left shoulder and to jaw and around to left side of chest.  Pain began around 930 this morning.  She reports that is coming in waves.  It has been a 10 and is now decreased to a 6.  She has had no interventions.  She has had no similar symptoms in the past.  She has a history of A-fib but is not on any anticoagulation.  Review of Systems  Positive: Chest pain radiating from back Negative: History of aneurysm, MI, fever, chills  Physical Exam  BP (!) 161/63 (BP Location: Right Arm)   Pulse 62   Temp 97.9 F (36.6 C) (Oral)   Resp 15   Ht 1.626 m (5' 4)   Wt 99.8 kg   SpO2 99%   BMI 37.76 kg/m  Gen:   Awake, no distress   Resp:  Normal effort  MSK:   Moves extremities without difficulty  Other:  Regular rate and rhythm  Medical Decision Making  Medically screening exam initiated at 11:56 AM.  Appropriate orders placed.  BRYCE KIMBLE was informed that the remainder of the evaluation will be completed by another provider, this initial triage assessment does not replace that evaluation, and the importance of remaining in the ED until their evaluation is complete. EKG reviewed no evidence of acute ischemia noted 53 year old female history of A-fib, not on anticoagulation, presents today complaining of pain from left upper back radiating into jaw and left anterior chest.  Plan CT chest to evaluate for dissection   Levander Houston, MD 08/06/23 1201

## 2023-08-06 NOTE — ED Provider Notes (Signed)
 [facility] Provider Note   CSN: [id number] Arrival date & time: 08/06/23  1110     Patient presents with: Chest Pain, Shortness of Breath, and Back Pain   Rhonda Anthony is a 53 y.o. female.   Patient is a 53 year old female who presents with chest pain.  She states this started about 9:00 this morning.  It was a discomfort in her upper back and radiated to her left chest and down her left arm.  She says it has been constant but waxes and wanes in intensity.  It is currently better than it has been.  No history of similar symptoms in the past.  No cough or cold symptoms.  It is a little bit worse with certain movements.  Is also worse with deep breathing.  She has a little bit of associated shortness of breath.  Little bit of nausea.  No vomiting.  No diaphoresis.  No leg pain or swelling.  No recent fevers.  No cough or cold symptoms.  No history of similar symptoms in the past.       Prior to Admission medications   Medication Sig Start Date End Date Taking? Authorizing Provider  Cholecalciferol (VITAMIN D3) 25 MCG (1000 UT) CAPS Take 1 capsule by mouth daily.    [provider]  loratadine (CLARITIN) 10 MG tablet Take 10 mg by mouth daily.    [provider]  magnesium (MAGTAB) 84 MG ( ) TBCR SR tablet Take 84 mg by mouth.    [provider]  metoprolol  tartrate (LOPRESSOR ) 25 MG tablet Take 1 tablet (25 mg total) by mouth every 8 (eight) hours as needed (racing heart beat). 05/18/22 06/17/22  Fenton, Clint R, PA  zinc gluconate 50 MG tablet Take 50 mg by mouth daily.    [provider]    Allergies: Biaxin [clarithromycin], Demerol [meperidine], Nsaids, and Phentermine     Review of Systems  Constitutional:  Negative for chills, diaphoresis, fatigue and fever.  HENT:  Negative for congestion, rhinorrhea and sneezing.   Eyes: Negative.   Respiratory:  Positive for chest tightness and shortness of  breath. Negative for cough.   Cardiovascular:  Negative for chest pain and leg swelling.  Gastrointestinal:  Positive for nausea. Negative for abdominal pain, blood in stool, diarrhea and vomiting.  Genitourinary:  Negative for difficulty urinating, flank pain, frequency and hematuria.  Musculoskeletal:  Positive for back pain. Negative for arthralgias.  Skin:  Negative for rash.  Neurological:  Negative for dizziness, speech difficulty, weakness, numbness and headaches.    Updated Vital Signs BP (!) 161/63 (BP Location: Right Arm)   Pulse 62   Temp 97.9 F (36.6 C) (Oral)   Resp 15   Ht 5' 4 (1.626 m)   Wt 99.8 kg   SpO2 99%   BMI 37.76 kg/m   Physical Exam Constitutional:      Appearance: She is well-developed.  HENT:     Head: Normocephalic and atraumatic.  Eyes:     Pupils: Pupils are equal, round, and reactive to light.  Cardiovascular:     Rate and Rhythm: Normal rate and regular rhythm.     Heart sounds: Normal heart sounds.  Pulmonary:     Effort: Pulmonary effort is normal. No respiratory distress.     Breath sounds: Normal breath sounds. No wheezing or rales.  Chest:     Chest wall: No tenderness.     Comments: Some tenderness on palpation of the  left upper back area, no spinal tenderness, no rashes Abdominal:     General: Bowel sounds are normal.     Palpations: Abdomen is soft.     Tenderness: There is no abdominal tenderness. There is no guarding or rebound.  Musculoskeletal:        General: Normal range of motion.     Cervical back: Normal range of motion and neck supple.     Comments: No edema or calf tenderness  Lymphadenopathy:     Cervical: No cervical adenopathy.  Skin:    General: Skin is warm and dry.     Findings: No rash.  Neurological:     Mental Status: She is alert and oriented to person, place, and time.     (all labs ordered are listed, but only abnormal results are displayed) Labs Reviewed  BASIC METABOLIC PANEL WITH GFR  CBC   TROPONIN I (HIGH SENSITIVITY)  TROPONIN I (HIGH SENSITIVITY)    EKG: EKG Interpretation Date/Time:  Tuesday August 06 2023 11:28:43 EDT Ventricular Rate:  62 PR Interval:  138 QRS Duration:  82 QT Interval:  392 QTC Calculation: 397 R Axis:   28  Text Interpretation: Normal sinus rhythm Possible Anterior infarct , age undetermined Abnormal ECG When compared with ECG of 07-Feb-2022 11:32, PREVIOUS ECG IS PRESENT Confirmed by Levander Houston [phone]) on 08/06/2023 12:00:29 PM  Radiology: CT Angio Chest/Abd/Pel for Dissection W and/or Wo Contrast Result Date: 08/06/2023 CLINICAL DATA:  Chest and back pain.  Acute aortic syndrome. EXAM: CT ANGIOGRAPHY CHEST, ABDOMEN AND PELVIS TECHNIQUE: Non-contrast CT of the chest was initially obtained. Multidetector CT imaging through the chest, abdomen and pelvis was performed using the standard protocol during bolus administration of intravenous contrast. Multiplanar reconstructed images and MIPs were obtained and reviewed to evaluate the vascular anatomy. RADIATION DOSE REDUCTION: This exam was performed according to the departmental dose-optimization program which includes automated exposure control, adjustment of the mA and/or kV according to patient size and/or use of iterative reconstruction technique. CONTRAST:  75mL OMNIPAQUE  IOHEXOL  350 MG/ML SOLN COMPARISON:  None Available. FINDINGS: CTA CHEST FINDINGS Cardiovascular: Preferential opacification of the thoracic aorta. No evidence of thoracic aortic aneurysm or dissection. Normal heart size. No pericardial effusion. Mediastinum/Nodes: No enlarged mediastinal, hilar, or axillary lymph nodes. Thyroid  gland, trachea, and esophagus demonstrate no significant findings. Lungs/Pleura: Lungs are clear. No pleural effusion or pneumothorax. Musculoskeletal: No chest wall abnormality. No acute or significant osseous findings. Review of the MIP images confirms the above findings. CTA ABDOMEN AND PELVIS FINDINGS VASCULAR  Aorta: Normal caliber aorta without aneurysm, dissection, vasculitis or significant stenosis. Celiac: Patent without evidence of aneurysm, dissection, vasculitis or significant stenosis. SMA: Patent without evidence of aneurysm, dissection, vasculitis or significant stenosis. Renals: Both renal arteries are patent without evidence of aneurysm, dissection, vasculitis, fibromuscular dysplasia or significant stenosis. IMA: Patent without evidence of aneurysm, dissection, vasculitis or significant stenosis. Inflow: Patent without evidence of aneurysm, dissection, vasculitis or significant stenosis. Veins: No obvious venous abnormality within the limitations of this arterial phase study. Review of the MIP images confirms the above findings. NON-VASCULAR Hepatobiliary: No focal liver abnormality is seen. No gallstones, gallbladder wall thickening, or biliary dilatation. Pancreas: Unremarkable. No pancreatic ductal dilatation or surrounding inflammatory changes. Spleen: Normal in size without focal abnormality. Adrenals/Urinary Tract: Adrenal glands are unremarkable. Kidneys are normal, without renal calculi, focal lesion, or hydronephrosis. Bladder is unremarkable. Stomach/Bowel: Stomach is within normal limits. Appendix appears normal. No evidence of bowel wall thickening, distention, or inflammatory changes.  Lymphatic: No adenopathy is noted. Reproductive: Status post hysterectomy. No adnexal masses. Other: No ascites or hernia is noted. Musculoskeletal: No acute or significant osseous findings. Review of the MIP images confirms the above findings. IMPRESSION: No evidence of thoracic or abdominal aortic dissection or aneurysm. Electronically Signed   By: Lynwood Landy Raddle M.D.   On: 08/06/2023 15:17   DG Chest 2 View Result Date: 08/06/2023 CLINICAL DATA:  CP and dyspnea EXAM: CHEST - 2 VIEW COMPARISON:  July 03, 2021 FINDINGS: No focal airspace consolidation, pleural effusion, or pneumothorax. No cardiomegaly.No acute  fracture or destructive lesion. IMPRESSION: No acute cardiopulmonary abnormality. Electronically Signed   By: Rogelia Myers M.D.   On: 08/06/2023 13:15     Procedures   Medications Ordered in the ED  iohexol  (OMNIPAQUE ) 350 MG/ML injection 75 mL (75 mLs Intravenous Contrast Given 08/06/23 1447)                                    Medical Decision Making Amount and/or Complexity of Data Reviewed Labs: ordered. Radiology: ordered.   This patient presents to the ED for concern of chest pain, this involves an extensive number of treatment options, and is a complaint that carries with it a high risk of complications and morbidity.  I considered the following differential and admission for this acute, potentially life threatening condition.  The differential diagnosis includes ACS, musculoskeletal pain, aortic dissection, pulmonary embolus, pneumothorax, pneumonia, pleurisy  MDM:    Patient is a 53 year old who presents with pain in her upper back and around to her left chest.  She had some associated shortness of breath.  Little bit of pleuritic component.  No hypoxia.  Vital signs are stable.  No tachycardia, no unilateral leg swelling.  Chest x-ray does not show any evidence of pneumothorax or pneumonia.  Her first troponin is negative. CT of the chest does not show any evidence of dissection.  Reviewed with Dr. Landy and there is no large or central PE noted.  Patient has minimal symptoms currently.  No shortness of breath.  Low suspicion of occult PE.  Patient declines need for any pain medication.  She says she has minimal symptoms currently.  If second troponin is normal, she can be discharged home.  Discussed having close follow-up with her PCP.  Return precautions given.  Care turned over to Dr Melvenia pending troponin. (Labs, imaging, consults)   Labs: I Ordered, and personally interpreted labs.  The pertinent results include: Normal troponin  Imaging Studies ordered: I ordered  imaging studies including chest x-ray, CTA of the chest abdomen pelvis I independently visualized and interpreted imaging. I agree with the radiologist interpretation  Additional history obtained from chart.  External records from outside source obtained and reviewed including prior notes  Cardiac Monitoring: The patient was maintained on a cardiac monitor.  If on the cardiac monitor, I personally viewed and interpreted the cardiac monitored which showed an underlying rhythm of: Sinus rhythm  Reevaluation: After the interventions noted above, I reevaluated the patient and found that they have :improved  Social Determinants of Health:    Disposition:  pending  Co morbidities that complicate the patient evaluation  Past Medical History:  Diagnosis Date   Abdominal pain, left lower quadrant 07/01/2007   Qualifier: Diagnosis of  By: Genie CMA (AAMA), Leisha     Chest pain 06/26/2012   GERD (gastroesophageal reflux disease)  Internal hemorrhoids without mention of complication 2009   Colonoscopy    Stomach ulcer    x 2   Stress at work 06/26/2012     Medicines Meds ordered this encounter  Medications   iohexol  (OMNIPAQUE ) 350 MG/ML injection 75 mL    I have reviewed the patients home medicines and have made adjustments as needed  Problem List / ED Course: Problem List Items Addressed This Visit   None Visit Diagnoses       Chest pain, unspecified type    -  Primary                Final diagnoses:  Chest pain, unspecified type    ED Discharge Orders     None          Lenor Hollering, MD 08/06/23 1533

## 2023-08-06 NOTE — ED Triage Notes (Addendum)
 Pt to ED c/o CP, SHOB and upper back pain that started aprox 2 hours ago progressively getting worse. Reports was standing in kitchen when symptoms occurred. HX A fib

## 2023-08-06 NOTE — Discharge Instructions (Addendum)
 Your test results today were reassuring.  Follow-up with your primary care doctor and cardiologist.  Return to the emergency department for any new or worsening symptoms of concern.

## 2024-03-28 IMAGING — CR DG CHEST 2V
2 series · 2 of 2 positions shown · non-contrast
Comparison: 07/01/2021

CLINICAL DATA: Chest pain and pressure

EXAM:
CHEST - 2 VIEW

[chest pa]
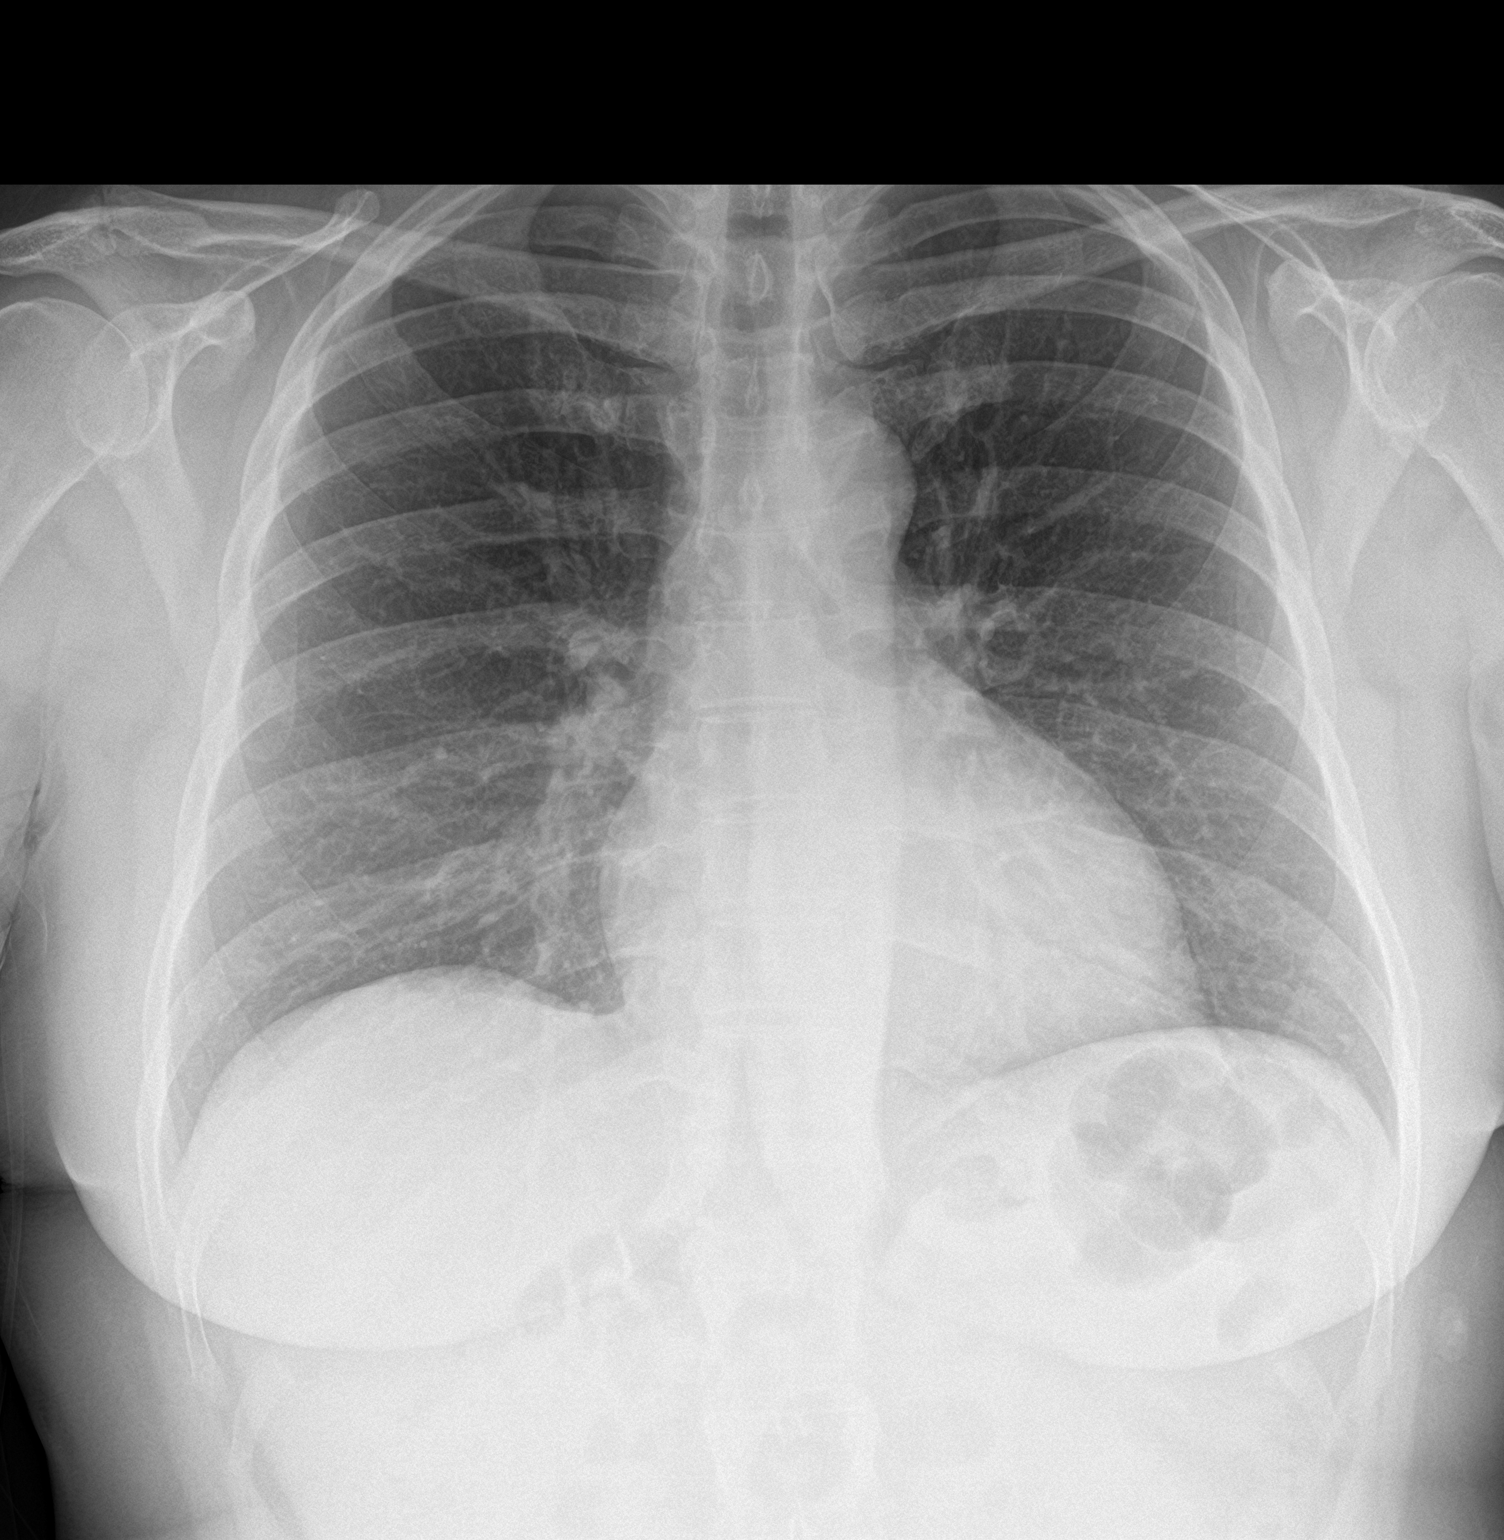

[chest lat]
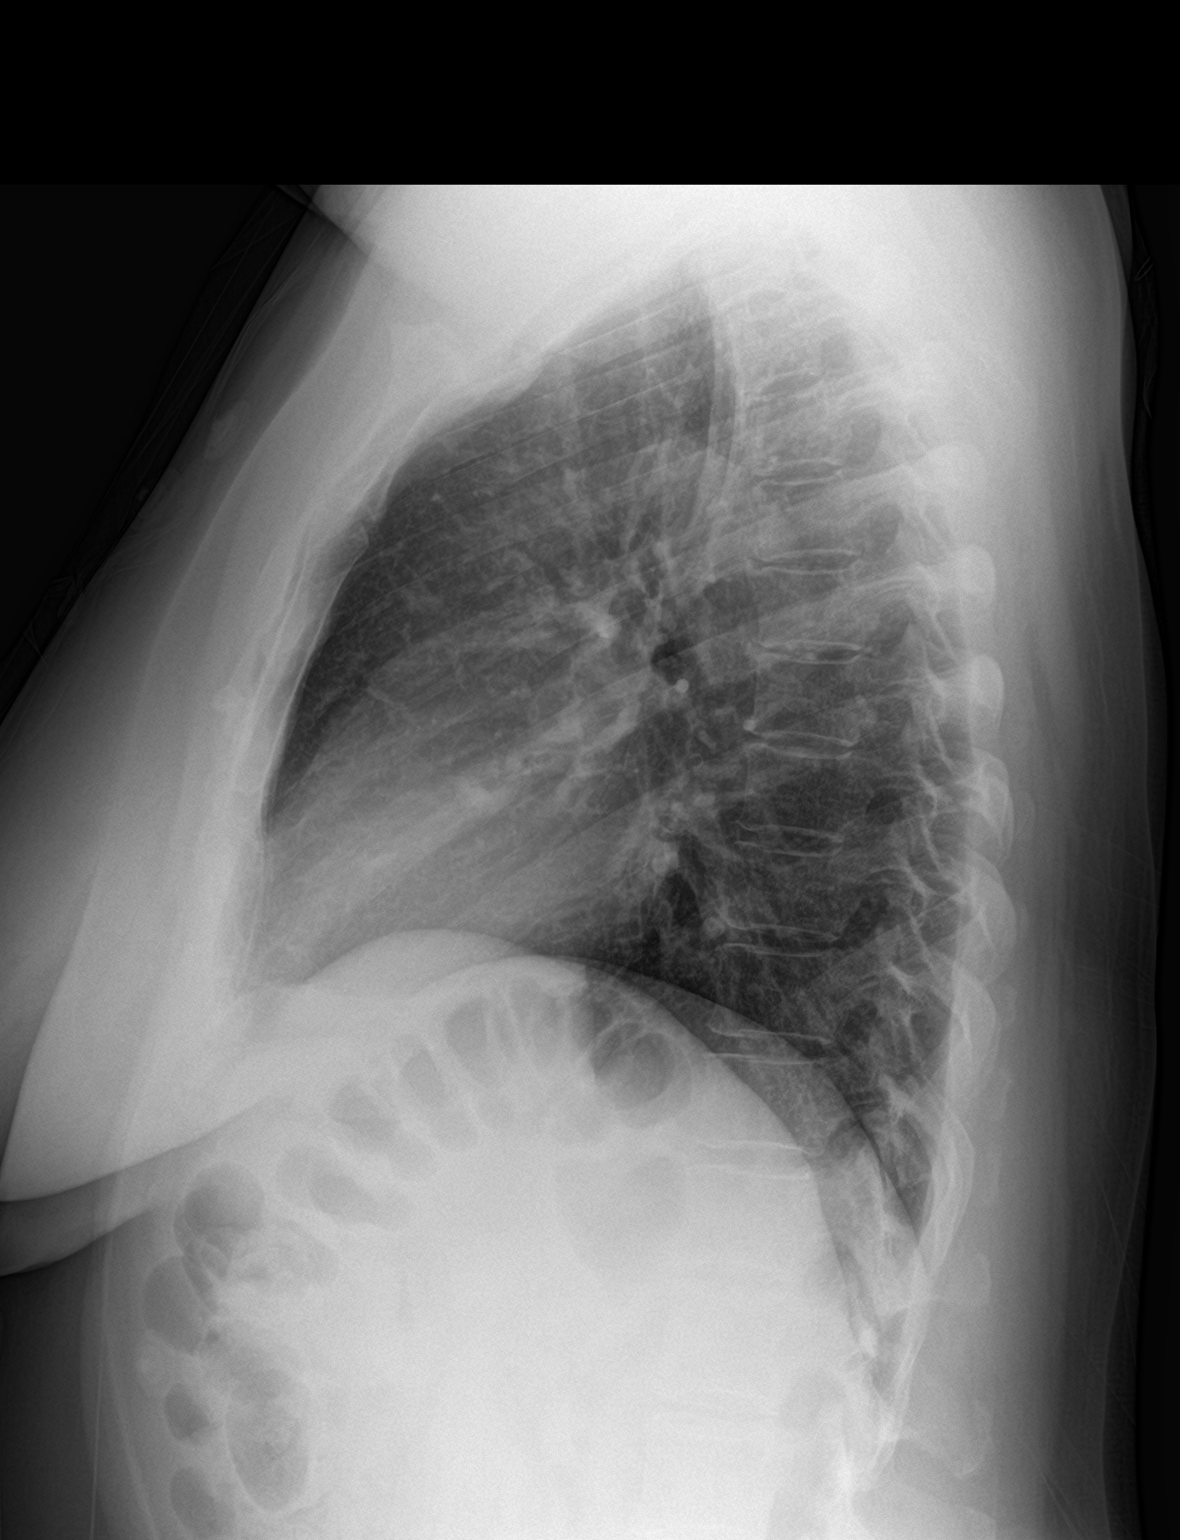

[2 of 2 positions shown; findings below may reference images not displayed]

FINDINGS: Heart size upper limits of normal. Mediastinal shadows are normal.
The lungs are clear. The vascularity is normal. No effusions. No
abnormal bone finding.
IMPRESSION: No active cardiopulmonary disease.
